# Patient Record
Sex: Male | Born: 1955 | Race: White | Hispanic: No | Marital: Married | Smoking: Current every day smoker
Health system: Southern US, Community
[De-identification: ages and names within clinical notes are randomized; demographics above are authoritative.]

## PROBLEM LIST (undated history)

## (undated) DIAGNOSIS — R0602 Shortness of breath: Secondary | ICD-10-CM

## (undated) DIAGNOSIS — J449 Chronic obstructive pulmonary disease, unspecified: Secondary | ICD-10-CM

## (undated) DIAGNOSIS — N401 Enlarged prostate with lower urinary tract symptoms: Secondary | ICD-10-CM

## (undated) DIAGNOSIS — Z79891 Long term (current) use of opiate analgesic: Secondary | ICD-10-CM

## (undated) DIAGNOSIS — M1990 Unspecified osteoarthritis, unspecified site: Secondary | ICD-10-CM

## (undated) DIAGNOSIS — S22000A Wedge compression fracture of unspecified thoracic vertebra, initial encounter for closed fracture: Secondary | ICD-10-CM

## (undated) DIAGNOSIS — K5903 Drug induced constipation: Secondary | ICD-10-CM

## (undated) DIAGNOSIS — S2020XA Contusion of thorax, unspecified, initial encounter: Secondary | ICD-10-CM

## (undated) DIAGNOSIS — IMO0001 Reserved for inherently not codable concepts without codable children: Secondary | ICD-10-CM

## (undated) DIAGNOSIS — J312 Chronic pharyngitis: Secondary | ICD-10-CM

## (undated) DIAGNOSIS — Z9981 Dependence on supplemental oxygen: Secondary | ICD-10-CM

## (undated) DIAGNOSIS — IMO0002 Reserved for concepts with insufficient information to code with codable children: Secondary | ICD-10-CM

## (undated) DIAGNOSIS — R51 Headache: Secondary | ICD-10-CM

## (undated) DIAGNOSIS — G4730 Sleep apnea, unspecified: Secondary | ICD-10-CM

## (undated) DIAGNOSIS — G894 Chronic pain syndrome: Secondary | ICD-10-CM

## (undated) DIAGNOSIS — Z860100 Personal history of colon polyps, unspecified: Secondary | ICD-10-CM

## (undated) DIAGNOSIS — J324 Chronic pansinusitis: Secondary | ICD-10-CM

## (undated) DIAGNOSIS — I219 Acute myocardial infarction, unspecified: Secondary | ICD-10-CM

## (undated) DIAGNOSIS — L089 Local infection of the skin and subcutaneous tissue, unspecified: Secondary | ICD-10-CM

## (undated) DIAGNOSIS — F329 Major depressive disorder, single episode, unspecified: Secondary | ICD-10-CM

## (undated) DIAGNOSIS — H65493 Other chronic nonsuppurative otitis media, bilateral: Secondary | ICD-10-CM

## (undated) DIAGNOSIS — G709 Myoneural disorder, unspecified: Secondary | ICD-10-CM

## (undated) DIAGNOSIS — Z973 Presence of spectacles and contact lenses: Secondary | ICD-10-CM

## (undated) DIAGNOSIS — R1310 Dysphagia, unspecified: Secondary | ICD-10-CM

## (undated) DIAGNOSIS — F32A Depression, unspecified: Secondary | ICD-10-CM

## (undated) DIAGNOSIS — K219 Gastro-esophageal reflux disease without esophagitis: Secondary | ICD-10-CM

## (undated) DIAGNOSIS — E119 Type 2 diabetes mellitus without complications: Secondary | ICD-10-CM

## (undated) DIAGNOSIS — F419 Anxiety disorder, unspecified: Secondary | ICD-10-CM

## (undated) DIAGNOSIS — I10 Essential (primary) hypertension: Secondary | ICD-10-CM

## (undated) HISTORY — DX: Benign prostatic hyperplasia with lower urinary tract symptoms: N40.1

## (undated) HISTORY — PX: APPENDECTOMY: SHX54

## (undated) HISTORY — DX: Acute myocardial infarction, unspecified: I21.9

## (undated) HISTORY — DX: Chronic pain syndrome: G89.4

## (undated) HISTORY — DX: Chronic pharyngitis: J31.2

## (undated) HISTORY — DX: Type 2 diabetes mellitus without complications: E11.9

## (undated) HISTORY — DX: Other chronic nonsuppurative otitis media, bilateral: H65.493

## (undated) HISTORY — DX: Contusion of thorax, unspecified, initial encounter: S20.20XA

## (undated) HISTORY — PX: HEMORROIDECTOMY: SUR656

## (undated) HISTORY — DX: Personal history of colonic polyps: Z86.010

## (undated) HISTORY — DX: Chronic pansinusitis: J32.4

## (undated) HISTORY — PX: BACK SURGERY: SHX140

## (undated) HISTORY — DX: Dependence on supplemental oxygen: Z99.81

## (undated) HISTORY — DX: Long term (current) use of opiate analgesic: Z79.891

## (undated) HISTORY — DX: Dysphagia, unspecified: R13.10

## (undated) HISTORY — DX: Essential (primary) hypertension: I10

## (undated) HISTORY — DX: Drug induced constipation: K59.03

## (undated) HISTORY — PX: NECK SURGERY: SHX720

## (undated) HISTORY — DX: Personal history of colon polyps, unspecified: Z86.0100

---
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
## 1998-06-26 ENCOUNTER — Ambulatory Visit (HOSPITAL_COMMUNITY): Admission: RE | Admit: 1998-06-26 | Discharge: 1998-06-26 | Payer: Self-pay | Admitting: Neurological Surgery

## 1998-06-26 ENCOUNTER — Encounter: Payer: Self-pay | Admitting: Neurological Surgery

## 2000-11-09 ENCOUNTER — Encounter: Payer: Self-pay | Admitting: Neurological Surgery

## 2000-11-09 ENCOUNTER — Encounter: Admission: RE | Admit: 2000-11-09 | Discharge: 2000-11-09 | Payer: Self-pay | Admitting: Neurological Surgery

## 2001-11-14 ENCOUNTER — Ambulatory Visit (HOSPITAL_COMMUNITY): Admission: RE | Admit: 2001-11-14 | Discharge: 2001-11-14 | Payer: Self-pay | Admitting: Neurological Surgery

## 2001-11-14 ENCOUNTER — Encounter: Payer: Self-pay | Admitting: Neurological Surgery

## 2001-12-13 ENCOUNTER — Encounter: Payer: Self-pay | Admitting: Neurological Surgery

## 2001-12-13 ENCOUNTER — Observation Stay (HOSPITAL_COMMUNITY): Admission: RE | Admit: 2001-12-13 | Discharge: 2001-12-14 | Payer: Self-pay | Admitting: Neurological Surgery

## 2002-07-28 ENCOUNTER — Ambulatory Visit (HOSPITAL_COMMUNITY): Admission: RE | Admit: 2002-07-28 | Discharge: 2002-07-28 | Payer: Self-pay | Admitting: Hematology and Oncology

## 2002-07-28 ENCOUNTER — Encounter: Payer: Self-pay | Admitting: Neurological Surgery

## 2003-09-09 ENCOUNTER — Emergency Department (HOSPITAL_COMMUNITY): Admission: EM | Admit: 2003-09-09 | Discharge: 2003-09-09 | Payer: Self-pay | Admitting: *Deleted

## 2003-09-28 ENCOUNTER — Ambulatory Visit (HOSPITAL_COMMUNITY): Admission: RE | Admit: 2003-09-28 | Discharge: 2003-09-28 | Payer: Self-pay | Admitting: Neurological Surgery

## 2003-11-08 ENCOUNTER — Inpatient Hospital Stay (HOSPITAL_COMMUNITY): Admission: RE | Admit: 2003-11-08 | Discharge: 2003-11-09 | Payer: Self-pay | Admitting: Neurological Surgery

## 2004-06-24 ENCOUNTER — Encounter (INDEPENDENT_AMBULATORY_CARE_PROVIDER_SITE_OTHER): Payer: Self-pay | Admitting: *Deleted

## 2004-06-24 ENCOUNTER — Inpatient Hospital Stay (HOSPITAL_COMMUNITY): Admission: RE | Admit: 2004-06-24 | Discharge: 2004-06-26 | Payer: Self-pay | Admitting: Neurological Surgery

## 2007-12-14 ENCOUNTER — Ambulatory Visit (HOSPITAL_COMMUNITY): Admission: RE | Admit: 2007-12-14 | Discharge: 2007-12-14 | Payer: Self-pay | Admitting: Neurological Surgery

## 2008-02-06 ENCOUNTER — Ambulatory Visit: Payer: Self-pay | Admitting: *Deleted

## 2008-02-06 ENCOUNTER — Inpatient Hospital Stay (HOSPITAL_COMMUNITY): Admission: RE | Admit: 2008-02-06 | Discharge: 2008-02-08 | Payer: Self-pay | Admitting: Neurological Surgery

## 2008-03-23 ENCOUNTER — Ambulatory Visit (HOSPITAL_COMMUNITY): Admission: RE | Admit: 2008-03-23 | Discharge: 2008-03-23 | Payer: Self-pay | Admitting: Neurological Surgery

## 2008-06-14 HISTORY — PX: ESOPHAGOGASTRODUODENOSCOPY: SHX1529

## 2010-06-06 HISTORY — PX: COLONOSCOPY: SHX174

## 2010-09-09 NOTE — Op Note (Signed)
NAMEAVRAHAM, [name] NO.:  [id number]   MEDICAL RECORD NO.:  [id number]          PATIENT TYPE:  INP   LOCATION:  3113                         FACILITY:  [facility]   PHYSICIAN:  Stefani Dama, M.D.  DATE OF BIRTH:  26-Sep-1955   DATE OF PROCEDURE:  02/06/2008  DATE OF DISCHARGE:                               OPERATIVE REPORT   PREOPERATIVE DIAGNOSES:  Spondylosis and stenosis, L3-L4; status post  arthrodesis, L4 to sacrum.   POSTOPERATIVE DIAGNOSES:  Spondylosis and stenosis, L3-L4; status post  arthrodesis, L4 to sacrum.   PROCEDURE:  Anterior lumbar decompression, L3-L4 arthrodesis with PEEK  spacer, local autograft, allograft, and anterior titanium plate  fixation.   SURGEON:  Stefani Dama, MD   FIRST ASSISTANT:  None.   CO-SURGEON:  Balinda Quails, MD for approach and closure.   INDICATIONS:  [name] is a 55 year old individual who has had  previous spondylosis and stenosis at L4-5 and L5-S1.  He has had chronic  back pain, severe cervical spondylosis including morbid obesity, smoking  history, and is now developed adjacent level of disease at L3-L4 with  stenosis at that level.  An anterior lumbar interbody arthrodesis is  being planned for that level.   PROCEDURE:  The patient was brought to the operating room and placed on  table in the supine position.  After smooth induction of general  endotracheal anesthesia, his abdomen was prepped with alcohol and  DuraPrep and then draped in sterile fashion.  A transverse incision was  made at the L3-L4 level after localizing this radiographically and then  Dr. Madilyn Fireman performed the anterior exposure to the retroperitoneal space.  When the disk space was exposed, I incised the disk space with #15  blade.  A combination of Kerrison rongeurs was used to evacuate a  significant quantity of severely desiccated and degenerated disk  material at the L3-L4 space.  The endplates were cleared of any  attachments  to the disk and the most dorsal aspect of the disk space.  The disk was removed and the ligament was undercut with a 2-mm and 3-mm  Kerrison punch.  Dissection was carried out to either side of lateral  midline to maintain good decompression exposure.  Then, after  decorticating the endplates with high-speed drill, the interspace was  sized with fluoroscopic guidance, and it was felt that a 16-mm tall, 30  x 33 mm footprint, 6-degree lordosis spacer would fit best.  This was  then filled with a combination of autograft and allograft.  Autograft  was in the form of bone marrow aspirate and the allograft was Vitoss  bone sponge mixed with an extra small mixture of Infuse.  This was  placed into the spacer and the spacer was countersunk appropriately  using fluoroscopic guidance.  Two small titanium plates were then placed  into the spacer to the vertebral body above and below to secure the  spacer and  position.  Final localizing radiograph was obtained in the AP and  lateral projection, and then the procedure was turned over to Dr. Liliane Bade  for final closure.  Blood loss for this portion of the procedure  was estimated 150 mL.  The patient tolerated the procedure well.      Stefani Dama, M.D.  Electronically Signed     HJE/MEDQ  D:  02/06/2008  T:  02/07/2008  Job:  [id number]

## 2010-09-09 NOTE — Op Note (Signed)
NAME:  [name], [name] NO.:  [id number]   MEDICAL RECORD NO.:  [id number]          PATIENT TYPE:  INP   LOCATION:  3113                         FACILITY:  [facility]   PHYSICIAN:  Balinda Quails, M.D.    DATE OF BIRTH:  [birth year]   DATE OF PROCEDURE:  02/06/2008  DATE OF DISCHARGE:                               OPERATIVE REPORT   SURGEON:  P Bud Face, MD   CO SURGEON:  Stefani Dama, MD   ANESTHETIC:  General endotracheal.   PREOPERATIVE DIAGNOSIS:  L3-4 degenerative disk disease.   POSTOPERATIVE DIAGNOSIS:  L3-4 degenerative disk disease.   PROCEDURE:  L3-4 anterior lumbar interbody fusion (ALIF).   CLINICAL NOTE:  Mr. [name] is a 55 year old gentleman with history  of chronic back problems.  Previous fusion at L4-5 and L5-S1.  These  were carried out posteriorly.  Brought to the operating room at this  time for planned L3-4 ALIF.  The patient was seen preoperatively,  details of the operative procedure reviewed including potential risks.  Major complication rate 2-3% to include but not limited to MI, DVT,  pulmonary embolus, bleeding, transfusion, infection, sexual dysfunction,  ureter injury or other major complication.   OPERATIVE PROCEDURE:  The patient was brought to the operating room in  stable condition.  Placed under general endotracheal anesthesia.  Foley  catheter, arterial line, and central venous catheter in place.  In the  supine position, the abdomen prepped and draped in a sterile fashion.   A transverse skin incision made at projection of L3-4 in the left lower  quadrant.  The incision extended deeply through the subcutaneous tissue.  Left anterior rectus sheath incised from midline to lateral margin of  the rectus muscle.  The incision was extended laterally due to the  patient's large body habitus and the oblique and transversus muscles  were also incised laterally beyond the rectus sheath.  The posterior  rectus sheath then  incised transversely and the peritoneum pushed off  the posterior rectus sheath creating the retroperitoneal space.  The  peritoneal contents rotated anteriorly.  The left psoas muscle  identified.  Left genitofemoral nerve was preserved.  The L3-4 disk was  palpated and identified with fluoroscopy and a spinal needle.  The L4  and L3 segmental vessels were ligated with clips and divided.  This  allowed full mobilization of the left common iliac vein which was  retracted to the right.  The peritoneal contents, ureter and the left  iliac vein were all retracted to the right.  The presacral fascial layer  was incised and the disk was exposed from left-to-right.  Full exposure  of the disk completed.   A Thompson-Brau Retractor System used.  Reverse lip blades were placed  on lateral margins of L3-4 bilaterally and malleable retractors placed  superiorly and inferiorly.  This allowed full exposure of the disk.  The  ALIF then completed by Dr. Danielle Dess.   After completion of the ALIF, all instruments were removed.  No  significant bleeding.  Pulse oximetry remained normal in left lower  extremity.  The posterior rectus sheath was closed with running 0 Vicryl suture.  The lateral muscle areas were closed with a single layer of interrupted  #1 Vicryl suture.  The anterior rectus sheath closed with running 0  Vicryl suture.  Subcutaneous layer deeply closed with running 2-0 Vicryl  suture, superficial layer with running 3-0 Vicryl suture and skin closed  with 4-0 Monocryl.  Dermabond applied.  Sterile dressings applied.   The patient tolerated the procedure well.  Transferred to recovery room  in stable condition.      Balinda Quails, M.D.  Electronically Signed     PGH/MEDQ  D:  02/06/2008  T:  02/07/2008  Job:  [id number]

## 2010-09-12 NOTE — Op Note (Signed)
NAMEHAYATO, [name] NO.:  [id number]   MEDICAL RECORD NO.:  [id number]          PATIENT TYPE:  INP   LOCATION:  2859                         FACILITY:  [facility]   PHYSICIAN:  Stefani Dama, M.D.  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   PROCEDURE:  Posterior supplemental arthrodesis with allograft and segmental  fixation from C4-C7 with Alphatek instrumentation.   SURGEON:  Stefani Dama, M.D.   FIRST ASSISTANT:  Dr. Lovell Sheehan.   ANESTHESIA:  General endotracheal.   INDICATIONS:  Mr. [name] is a 55 year old individual who has had  problems with severe spondylitic disease in the cervical spine in the past.  He underwent initially an anterior cervical arthrodesis at C4-5 and C5-6.  He was then noted to have developed spondylitic disease at C6-C7.  About six  months ago he underwent arthrodesis anteriorly with decompression of C6-C7  level. During subsequent postoperative period, it was noted that he had  continued to complain of substantial pain and further evaluation  demonstrated that he had developed the pseudoarthrosis at C5-C6 level.  Review of his previous film suggested that there was a solid arthrodesis at  that level with no motion on flexion/extension films, however, subsequent  workup since as recuperation has demonstrated that there indeed is evidence  of motion across the C5-C6 level and osteophytic processes appear to be  evolving both anteriorly and posteriorly.  After careful consideration of  the options, the patient was advised regarding supplemental posterior fusion  with fixation being placed from all the fused levels from C4 down to C7. He  is now taken to the operating room for this procedure.   PROCEDURE:  The patient was brought to the  operating room supine on the  stretcher.  After smooth induction of general endotracheal anesthesia, he  was placed in the three-pin head rest and then carefully turned to the prone  position. Bony prominences were appropriately padded and protected and care  was taken to position his neck carefully in a slightly flexed position so as  to unfold the posterior skin folds.  The back of the neck was then prepped  with DuraPrep, draped in sterile fashion. A vertical incision was created in  the midline of the back and small skin tag right of midline in the midst of  the incision was excised and sent for separate pathologic examination.  Dissection was taken down to the cervical dorsal fascia which was opened on  either side of the midline and the first palpable spinous process was felt  to be the spinous process of C2 and then by further palpation, the spinous  processes of C3 and C4 were marked and a localizing radiograph identified  these positively. Further visualization below the level of C4 was not  possible because of the patient's rather large size across the shoulders and  his neck.  Therefore, once the C4 was identified  positively, subperiosteal  dissection was carried out along the laminar arches and facet joints from C4  down to C7.  The areas were packed off laterally and then a McCullough  retractor was placed deep in the wound and exposed the spinous processes,  laminar arches and the facette joints overlying these areas.  Care was taken  to maintain the dissection in subperiosteal plane and then fascia overlying  the facette joint was stripped out so that the facet joints could be drilled  out with a 2.3 mm dissecting tool. Once the cartilaginous fascia was  removed, the interspinous ligament was opened from between the spinous  processes of C4 to C7. A large round bur was then used to decorticate all  the bony margins along the facet and the laminar and spinous processes so as   to allow packing of bone graft in this region. Bleeding from the underlying  cancellus bone was harvested and this was mixed with an aliquot of bone  graft substitute which was a silicated substance, for a total volume of 10  mL of bone allograft that was mixed with the patient's own blood obtained  from the bleeding bony surfaces.  Facette screw entry sites were then chosen  along each of the facette laminar complexes using standard Roy-Camille  technique using a trajectory slightly medial 30 degrees cephalad, 30 degrees  lateral projection.  The screws were then placed.  Screw holes were drilled  down to 14 mm size.  There were 3 mm diameter holes.  They were tapped with  3.5 mm tap and then screws were placed in the C4, C5, C6 and C7 lateral  masses on each side.  The alignment of the screws was then obtained perfunctorily and the 6 cm rod  was sized between these and curved into a very modest curve to fit the screw  heads nicely.  The screw heads were then applied and they were sequentially  tightened down adding small amounts of torque so as to maintain the  alignment of the cervical spine in its anatomic position. Once the system  was torqued down totally, bone graft was then packed into the facette joints  and all along the interlaminar spaces from C4-C7 with a bone graft thus  being packed.  The wound was inspected for hemostasis and then the retractor  was removed and the paraspinal muscles were replaced into their original  position. The cervical dorsal fascia was closed with #1 Vicryl over a small  Jackson-Pratt drain which was brought out through a separate stab incision.  The cervical dorsal fascia was then further closed with 2-0 Vicryl, 3-0  Vicryl and surgical staples were placed in the cervical skin. The 3-0 nylon  suture was placed in the drain site to secure it. The wound was then cleansed with bacitracin irrigating solution. A dry sterile dressing was  applied to the  skin. The patient was then turned back to the supine position  with the head frame and pin sites being removed.  They were inspected and no  bleeding was noted immediately. The patient was then extubated and returned  to recovery room in stable condition.      HJE/MEDQ  D:  06/24/2004  T:  06/24/2004  Job:  [id number]

## 2010-09-12 NOTE — Op Note (Signed)
NAME:  [name], [name] NO.:  [id number]   MEDICAL RECORD NO.:  [id number]                   PATIENT TYPE:  INP   LOCATION:  3004                                 FACILITY:  [facility]   PHYSICIAN:  Stefani Dama, M.D.               DATE OF BIRTH:  05-May-1955   DATE OF PROCEDURE:  12/13/2001  DATE OF DISCHARGE:  12/14/2001                                 OPERATIVE REPORT   PREOPERATIVE DIAGNOSIS:  Cervical spondylosis with left cervical  radiculopathy, C4-5, C5-6, and C6-7.   POSTOPERATIVE DIAGNOSIS:  Cervical spondylosis with left cervical  radiculopathy, C4-5, C5-6, and C6-7.   PROCEDURE:  Anterior cervical diskectomy and arthrodesis with structural  allograft, Synthes fixation, C4-5, C5-6, and C6-7.   SURGEON:  Stefani Dama, M.D.   ASSISTANT:  Hewitt Shorts, M.D.   ANESTHESIA:  General endotracheal.   INDICATIONS:  The patient is a 55 year old individual who has had  significant spondylitic changes of his lumbar spine and now has developed  problems in his cervical spine with cervical radiculopathy primarily  involving the C5 and C6 distributions on the left side.  MRI demonstrates  that he has significant spondylitic disease, and a myelogram confirmed this  process about a month ago.  He was advised, having failed extensive efforts  at conservative management of this process, regarding anterior diskectomy  and arthrodesis.   DESCRIPTION OF PROCEDURE:  The patient was brought to the operating room and  placed on the table in the supine position.  After the smooth induction of  general endotracheal anesthesia, he was placed in five pounds of Holter  traction.  The neck was prepped with Duraprep and draped in a sterile  fashion.  A transverse incision was made on the left side of the neck and  carried down to the platysma.  The plane between the sternocleidomastoid and  the strap muscles was dissected bluntly until the prevertebral space  was  reached.  The first identifiable disk space was noted to be that of C5-6 on  the radiograph.  Dissection of the prevertebral tissues was then undertaken  cephalad and caudad to expose C4-5, C5-6, and C6-7.  A self-retaining Caspar  retractor was placed under the longus colli muscle, and then a diskectomy at  C5-6 was undertaken, opening the anterior portion of the longitudinal  ligament with a 15 blade.  A combination of curettes and rongeurs was then  used to evacuate the disk of significant degenerated disk material.  As the  posterior longitudinal ligament was reached, there was noted to be evidence  of disk material under the ligament in the subligamentous space behind the  vertebral body of C5.  This was retrieved and then the ligament was opened  fully.  The nerve root was decompressed.  The uncinate process off of the  body of C6 was removed from the left side.  The  right side was similarly  decompressed, and no significant spurring was identified on this side.  Once  the dissection was completed, hemostasis from the epidural veins was  obtained with some Gelfoam pledgets soaked in thrombin, which were later  removed and irrigated away.  A 7 mm tricortical graft was placed with the  cortical surface facing dorsally.  The ventral aspect was trimmed.  Attention was then turned to C6-7, where a similar procedure was carried  out.  Here again a large osteophytic overgrowth and some subligamentous disk  herniation was encountered on the left side at C6-7.  Once this was  decompressed, a 7 mm tricortical graft was again fashioned into the  interspace.  Then C4-5 was then attended to in a similar fashion.  Once the  diskectomies were completed, hemostasis was checked in all of the soft  tissues.  A 57 mm standard-size Synthes plate was affixed to the ventral  aspects of the vertebral bodies with eight locking 4 x 14 mm screws.  Placement of the construct was obtained radiographically,  and it was felt to  be adequate.  The area was then checked for hemostasis, and the platysma was  closed with 3-0 Vicryl in interrupted fashion, and 3-0 Vicryl was used in  the subcuticular tissues.  Dermabond was placed on the skin.  The patient  tolerated the procedure well and was returned to the recovery room in stable  condition.                                               Stefani Dama, M.D.    Merla Riches  D:  12/13/2001  T:  12/14/2001  Job:  [id number]
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
## 2010-09-12 NOTE — Discharge Summary (Signed)
[name], KADRMAS NO.:  [id number]   MEDICAL RECORD NO.:  [id number]          PATIENT TYPE:  INP   LOCATION:  3012                         FACILITY:  [facility]   PHYSICIAN:  Stefani Dama, M.D.  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   MAJOR OPERATION:  Posterior supplemental arthrodesis with fixation C4-C7.   INDICATIONS:  [name] is a 55 year old individual who has had  previous anterior arthrodesis at C5-6 and at C4-5.  The patient had  complaints of significant neck pain, shoulder pain with radiation to the  proximal aspect of the arms.  After careful evaluation he was found to have  pseudoarthrosis at the level C5-6 and at C6-7.  He was advised regarding  supplemental arthrodesis which will be done posterior approach and this was  undertaken on June 24, 2004 via a posterior stabilization using lateral  mass facet screws from C4-C7 and placing bone graft in the intervening space  with Allograft.  The patient underwent the above-named procedure and  tolerated it fairly well.  Complained of significant neck pain and shoulder  pain on the first and on the second it seemed that the pain was subsiding.  His incision remained clean and dry.  A surgical drain was placed  immediately postoperatively and this was removed on the first postoperative  day as the patient was able to tolerate oral pain medications.  He was  ambulatory in a hard cervical collar.  He was discharged home with  postoperative instructions.  He was given a prescription for Percocet as  needed for pain in addition for Valium as muscle spasm.   CONDITION ON DISCHARGE:  Improved.   FINAL DIAGNOSES:  1.  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  2.  Chronic depression with anxiety  disorder.       HJE/MEDQ  D:  09/04/2004  T:  09/04/2004  Job:  [id number]

## 2011-01-26 LAB — TYPE AND SCREEN
ABO/RH(D): O POS
Antibody Screen: NEGATIVE

## 2011-01-26 LAB — GLUCOSE, CAPILLARY: Glucose-Capillary: 127 — ABNORMAL HIGH

## 2011-01-26 LAB — CBC
HCT: 46.1
Platelets: 220
RBC: 4.79
WBC: 8.8

## 2011-01-26 LAB — BASIC METABOLIC PANEL
Chloride: 105
Creatinine, Ser: 0.85
GFR calc Af Amer: >60
GFR calc non Af Amer: >60
Sodium: 140

## 2011-01-26 LAB — ABO/RH: ABO/RH(D): O POS

## 2011-07-01 ENCOUNTER — Other Ambulatory Visit: Payer: Self-pay | Admitting: Neurological Surgery

## 2011-07-01 DIAGNOSIS — M5416 Radiculopathy, lumbar region: Secondary | ICD-10-CM

## 2011-07-04 ENCOUNTER — Ambulatory Visit
Admission: RE | Admit: 2011-07-04 | Discharge: 2011-07-04 | Disposition: A | Discharge status: Home / Self Care | Payer: Medicare Other | Source: Ambulatory Visit | Attending: Neurological Surgery | Admitting: Neurological Surgery

## 2011-07-04 DIAGNOSIS — M5416 Radiculopathy, lumbar region: Secondary | ICD-10-CM

## 2011-07-04 MED ORDER — GADOBENATE DIMEGLUMINE 529 MG/ML IV SOLN
20.0000 mL | Freq: Once | INTRAVENOUS | Status: AC | PRN
  Administered 2011-07-04: 20 mL via INTRAVENOUS

## 2011-07-10 ENCOUNTER — Other Ambulatory Visit: Payer: Self-pay | Admitting: Neurological Surgery

## 2011-07-16 ENCOUNTER — Encounter (HOSPITAL_COMMUNITY): Payer: Self-pay | Admitting: Pharmacy Technician

## 2011-07-21 ENCOUNTER — Other Ambulatory Visit (HOSPITAL_COMMUNITY): Payer: No Typology Code available for payment source

## 2011-07-21 ENCOUNTER — Encounter (HOSPITAL_COMMUNITY): Payer: Self-pay

## 2011-07-21 ENCOUNTER — Encounter (HOSPITAL_COMMUNITY)
Admission: RE | Admit: 2011-07-21 | Discharge: 2011-07-21 | Disposition: A | Discharge status: Home / Self Care | Payer: No Typology Code available for payment source | Source: Ambulatory Visit | Attending: Neurological Surgery | Admitting: Neurological Surgery

## 2011-07-21 HISTORY — DX: Unspecified osteoarthritis, unspecified site: M19.90

## 2011-07-21 HISTORY — DX: Headache: R51

## 2011-07-21 HISTORY — DX: Shortness of breath: R06.02

## 2011-07-21 HISTORY — DX: Sleep apnea, unspecified: G47.30

## 2011-07-21 HISTORY — DX: Myoneural disorder, unspecified: G70.9

## 2011-07-21 HISTORY — DX: Reserved for inherently not codable concepts without codable children: IMO0001

## 2011-07-21 HISTORY — DX: Local infection of the skin and subcutaneous tissue, unspecified: L08.9

## 2011-07-21 HISTORY — DX: Chronic obstructive pulmonary disease, unspecified: J44.9

## 2011-07-21 HISTORY — DX: Reserved for concepts with insufficient information to code with codable children: IMO0002

## 2011-07-21 HISTORY — DX: Anxiety disorder, unspecified: F41.9

## 2011-07-21 LAB — BASIC METABOLIC PANEL
BUN: 8 mg/dL (ref 6–23)
Chloride: 103 mEq/L (ref 96–112)
GFR calc Af Amer: >90 mL/min (ref >90)
GFR calc non Af Amer: >90 mL/min (ref >90)
Glucose, Bld: 101 mg/dL — ABNORMAL HIGH (ref 70–99)
Potassium: 5.2 mEq/L — ABNORMAL HIGH (ref 3.5–5.1)
Sodium: 137 mEq/L (ref 135–145)

## 2011-07-21 LAB — CBC
HCT: 48.9 % (ref 39.0–52.0)
Hemoglobin: 17.3 g/dL — ABNORMAL HIGH (ref 13.0–17.0)
MCH: 33.1 pg (ref 26.0–34.0)
MCHC: 35.4 g/dL (ref 30.0–36.0)
RBC: 5.22 MIL/uL (ref 4.22–5.81)

## 2011-07-21 LAB — TYPE AND SCREEN
ABO/RH(D): O POS
Antibody Screen: NEGATIVE

## 2011-07-21 NOTE — Progress Notes (Signed)
Will request sleep study fr. [facility].

## 2011-07-21 NOTE — Pre-Procedure Instructions (Signed)
20 [name]  07/21/2011   Your procedure is scheduled on:  07/27/2011  Report to [facility] at 5:30 AM.  Call this number if you have problems the morning of surgery: [phone]   Remember:   Do not eat food:After Midnight.  May have clear liquids: up to 4 Hours before arrival.  NOTHING AFTER 1:30 A.M.  Clear liquids include soda, tea, black coffee, apple or grape juice, broth.  Take these medicines the morning of surgery with A SIP OF WATER:  VALIUM, INHALERS, FLOMAX, LYRICA, PAIN MEDICINE AS NEEDED   Do not wear jewelry, make-up or nail polish.  Do not wear lotions, powders, or perfumes. You may wear deodorant.   Do not bring valuables to the hospital.  Contacts, dentures or bridgework may not be worn into surgery.  Leave suitcase in the car. After surgery it may be brought to your room.  For patients admitted to the hospital, checkout time is 11:00 AM the day of discharge.   Patients discharged the day of surgery will not be allowed to drive home.  Name and phone number of your driver:   WITH FAMILY  Special Instructions: CHG Shower Use Special Wash: 1/2 bottle night before surgery and 1/2 bottle morning of surgery.   Please read over the following fact sheets that you were given: Pain Booklet, Coughing and Deep Breathing, Blood Transfusion Information, MRSA Information and Surgical Site Infection Prevention

## 2011-07-21 NOTE — Progress Notes (Signed)
Pt. Denies CP, difficulty breathing.  Reports SOB fr. Pain. No need for CXR or EKG, per anesth. Orders . Last EKG- 2009, preop.

## 2011-07-26 MED ORDER — CEFAZOLIN SODIUM-DEXTROSE 2-3 GM-% IV SOLR
2.0000 g | Freq: Once | INTRAVENOUS | Status: AC
  Administered 2011-07-27: 2 g via INTRAVENOUS
  Administered 2011-07-27: 1 g via INTRAVENOUS
  Filled 2011-07-26: qty 50

## 2011-07-26 NOTE — H&P (Signed)
[name] is an 56 y.o. male.   Chief Complaint: Back pain , leg weakness HPI:  [name]   #[id number] DOB:  02-Dec-1955 07/08/2011:  [name] returns to the office today, his birthday.  On the 9th he had an MRI of his lumbar spine. The study demonstrates that at L3-4 where he has an anterior interbody fusion, he has a significant posterolateral spur causing severe stenosis and left-sided nerve root compromise.    My concern is that he may have a pseudoarthrosis at the level of L3-4.  If this is the case, then he needs revision of his fusion. This would be done through a posterior operation. I demonstrated the findings to [name] and his wife. I noted that he has been having mostly left lower extremity symptoms and given this image, I believe that ultimately he needs to have this area decompressed and re-stabilized if necessary.  This would be done through a posterior operation.  This may involve placement of hardware across L3-4 and revision of the lower hardware in order to accommodate this.  Though this is a substantial operation, I believe that in [name]'s case he should feel considerably better once he has this area decompressed and stabilized.  We will plan on scheduling the surgery at the earliest convenience.          Stefani Dama, M.D./aft  [facility] [address] [place], [place] [address] [phone] Fax: [phone] [name]. #[id number] DOB: [birth year] 05/09/18 10 HPI: [name] returns to the office today for further followup regarding his lumbar spine having had an anterior lumbar decompression arthrodesis at the L3-4 level above a previously posteriorly stabilized L4 to sacrum. Clinically, [name] is having still persistence of left-sided testicular pain. He notes that the vise may have been eased just a little bit but the pain is quite severe still. He had been seen by an urologist and was told that he had prostatitis. He has been on  four weeks of doxycycline and feels that this may be easing slightly. Nonetheless, he is still requiring considerable pain medication and he notes that his pain management has been very poor. He is using between four and six of the oxycodone 10/325 mg a day in addition to OxyContin 20 thg b.i.d. He does not feel that the oxycodone does take care of his pain quite as well as the hydrocodone did. RECOMMENDATIONS: We discussed switching him back to hydrocodone today. I indicated that he would likely require at least 8 of the hydrocodone 10/325 mg a day to get some equivalency to his current pain regimen. I will do this for him today, writing him a prescription for the hydrocodone 10/ mg. He is taking Lyrica 150mg  a day and I also advised that we can double this medication to 150mg  b.i.d. and I will write him a new prescription for Lyrica. Additionally, [name] tells me that he has been concerned about the enlarged gallbladder that we saw on the scan. He does have a gastroenterologist in the [place] area that he has trusted in the past with other family members, and he would like to be [name] and evaluated by him for his enlarged gallbladder. I will make an arrangement for him to see Dr. Chales Abrahams at [facility]. In the meantime, his radiographs do show that his interbody graft is nicely placed. It is healing well. The alignment is well maintained. His back, otherwise, looks quite stable. I am hopeful  that his pain will come under better control with the passage of time. [name] is also noting to me that he has been having worsening neck pain and neck symptoms and his headaches were coming back.  Past Medical History  Diagnosis Date  . Normal cardiac stress test     2005, done prior to back surgery  . COPD (chronic obstructive pulmonary disease)   . Sleep apnea     study- 2012[facility] , Dr. Blenda Nicely, [address]  , uses CPAP  qo night   . Headache     uses excedrin prn  . Neuromuscular  disorder     pseudoarthrosis   . Arthritis     lumbar stenosis   . Anxiety     went off medicines for anxiety 6 months ago ( 12/2010), pt. "stays nervous", per pt.   . Cysts     groin area (on & off- for 4 +yrs.) for which he uses Doxycyline on going, BID, Rx fr. Dr. Fatima Blank in [place], [place].,    . History of normal resting EKG     2009, preop  . Finger infection     05/2011, L hand, index finger  - I&D in MD office, treated /w antibiotic.Marland KitchenMarland KitchenMarland Kitchen?name  . Shortness of breath     pt. reports related to pain     Past Surgical History  Procedure Date  . Back surgery     multiple, lumbar & cerv. fusions, last low back- 2009  . Hemorroidectomy     [facility]  . Appendectomy     as a teen     Family History  Problem Relation Age of Onset  . Anesthesia problems Neg Hx    Social History:  reports that he has been smoking.  He does not have any smokeless tobacco history on file. He reports that he does not drink alcohol or use illicit drugs.  Allergies: No Known Allergies  Medications Prior to Admission  Medication Dose Route Frequency Provider Last Rate Last Dose  . ceFAZolin (ANCEF) IVPB 2 g/50 mL premix  2 g Intravenous Once Barnett Abu, MD       Medications Prior to Admission  Medication Sig Dispense Refill  . ACETAMINOPHEN-CAFF-BUTALBITAL 50-500-40 MG CAPS Take 1 tablet by mouth every 4 (four) hours as needed. For migraine      . diazepam (VALIUM) 5 MG tablet Take 5 mg by mouth 4 (four) times daily.      Marland Kitchen doxycycline (VIBRA-TABS) 100 MG tablet Take 100 mg by mouth 2 (two) times daily.      . mometasone-formoterol (DULERA) 100-5 MCG/ACT AERO Inhale 2 puffs into the lungs 2 (two) times daily.      Marland Kitchen oxycodone (OXYCONTIN) 30 MG TB12 Take 30 mg by mouth 3 (three) times daily.      . pregabalin (LYRICA) 150 MG capsule Take 150 mg by mouth 2 (two) times daily.      . Tamsulosin HCl (FLOMAX) 0.4 MG CAPS Take 0.4 mg by mouth every morning.       . tiotropium (SPIRIVA) 18 MCG  inhalation capsule Place 18 mcg into inhaler and inhale daily.      . traZODone (DESYREL) 50 MG tablet Take 50 mg by mouth at bedtime as needed. For sleep        No results found for this or any previous visit (from the past 48 hour(s)). No results found.  Review of Systems  HENT: Negative.   Eyes: Negative.   Respiratory:  Negative.   Cardiovascular: Negative.   Gastrointestinal: Negative.   Genitourinary: Negative.   Musculoskeletal: Positive for back pain.  Skin: Negative.   Neurological: Positive for sensory change, focal weakness and weakness.  Endo/Heme/Allergies: Negative.   Psychiatric/Behavioral: Positive for depression.    There were no vitals taken for this visit. Physical Exam  Constitutional: He is oriented to person, place, and time. He appears well-developed and well-nourished.  HENT:  Head: Normocephalic and atraumatic.  Eyes: Conjunctivae and EOM are normal. Pupils are equal, round, and reactive to light.  Neck: Neck supple. No JVD present. No tracheal deviation present. No thyromegaly present.  Cardiovascular: Normal rate, regular rhythm and normal heart sounds.   Respiratory: Effort normal and breath sounds normal. No stridor.  GI: Soft. Bowel sounds are normal.  Lymphadenopathy:    He has no cervical adenopathy.  Neurological: He is alert and oriented to person, place, and time. He displays normal reflexes. No cranial nerve deficit. He exhibits abnormal muscle tone. Coordination normal.  Skin: Skin is warm and dry.  Psychiatric: He has a normal mood and affect. His behavior is normal. Judgment and thought content normal.     Assessment/Plan L3-4 stenosis possible pseudoarthrosis. For decompression, stablization.  Nyasha Rahilly J 07/26/2011, 9:55 PM

## 2011-07-27 ENCOUNTER — Inpatient Hospital Stay (HOSPITAL_COMMUNITY): Payer: No Typology Code available for payment source

## 2011-07-27 ENCOUNTER — Inpatient Hospital Stay (HOSPITAL_COMMUNITY)
Admission: RE | Admit: 2011-07-27 | Discharge: 2011-07-29 | DRG: 460 | Disposition: A | Discharge status: Home / Self Care | Payer: No Typology Code available for payment source | Source: Ambulatory Visit | Attending: Neurological Surgery | Admitting: Neurological Surgery

## 2011-07-27 ENCOUNTER — Inpatient Hospital Stay (HOSPITAL_COMMUNITY): Payer: No Typology Code available for payment source | Admitting: Anesthesiology

## 2011-07-27 ENCOUNTER — Encounter (HOSPITAL_COMMUNITY): Payer: Self-pay | Admitting: Anesthesiology

## 2011-07-27 ENCOUNTER — Encounter (HOSPITAL_COMMUNITY)
Admission: RE | Disposition: A | Discharge status: Home / Self Care | Payer: Self-pay | Source: Ambulatory Visit | Attending: Neurological Surgery

## 2011-07-27 DIAGNOSIS — Y831 Surgical operation with implant of artificial internal device as the cause of abnormal reaction of the patient, or of later complication, without mention of misadventure at the time of the procedure: Secondary | ICD-10-CM | POA: Diagnosis present

## 2011-07-27 DIAGNOSIS — S32009K Unspecified fracture of unspecified lumbar vertebra, subsequent encounter for fracture with nonunion: Secondary | ICD-10-CM

## 2011-07-27 DIAGNOSIS — T84498A Other mechanical complication of other internal orthopedic devices, implants and grafts, initial encounter: Principal | ICD-10-CM | POA: Diagnosis present

## 2011-07-27 DIAGNOSIS — F17200 Nicotine dependence, unspecified, uncomplicated: Secondary | ICD-10-CM | POA: Diagnosis present

## 2011-07-27 DIAGNOSIS — J449 Chronic obstructive pulmonary disease, unspecified: Secondary | ICD-10-CM | POA: Diagnosis present

## 2011-07-27 DIAGNOSIS — J4489 Other specified chronic obstructive pulmonary disease: Secondary | ICD-10-CM | POA: Diagnosis present

## 2011-07-27 DIAGNOSIS — Z01812 Encounter for preprocedural laboratory examination: Secondary | ICD-10-CM

## 2011-07-27 SURGERY — POSTERIOR LUMBAR FUSION
Anesthesia: General | Wound class: Clean

## 2011-07-27 MED ORDER — SODIUM CHLORIDE 0.9 % IV SOLN
INTRAVENOUS | Status: AC
  Filled 2011-07-27: qty 500

## 2011-07-27 MED ORDER — TRAZODONE HCL 50 MG PO TABS
50.0000 mg | ORAL_TABLET | Freq: Every evening | ORAL | Status: DC | PRN
  Filled 2011-07-27: qty 1

## 2011-07-27 MED ORDER — ROCURONIUM BROMIDE 100 MG/10ML IV SOLN
INTRAVENOUS | Status: DC | PRN
  Administered 2011-07-27: 30 mg via INTRAVENOUS
  Administered 2011-07-27: 50 mg via INTRAVENOUS
  Administered 2011-07-27: 20 mg via INTRAVENOUS
  Administered 2011-07-27: 50 mg via INTRAVENOUS

## 2011-07-27 MED ORDER — ONDANSETRON HCL 4 MG/2ML IJ SOLN
4.0000 mg | INTRAMUSCULAR | Status: DC | PRN
  Filled 2011-07-27: qty 2

## 2011-07-27 MED ORDER — GLYCOPYRROLATE 0.2 MG/ML IJ SOLN
INTRAMUSCULAR | Status: DC | PRN
  Administered 2011-07-27: .6 mg via INTRAVENOUS

## 2011-07-27 MED ORDER — VECURONIUM BROMIDE 10 MG IV SOLR
INTRAVENOUS | Status: DC | PRN
  Administered 2011-07-27: 2 mg via INTRAVENOUS

## 2011-07-27 MED ORDER — MIDAZOLAM HCL 5 MG/5ML IJ SOLN
INTRAMUSCULAR | Status: DC | PRN
  Administered 2011-07-27: 2 mg via INTRAVENOUS

## 2011-07-27 MED ORDER — TIOTROPIUM BROMIDE MONOHYDRATE 18 MCG IN CAPS
18.0000 ug | ORAL_CAPSULE | Freq: Every day | RESPIRATORY_TRACT | Status: DC
  Administered 2011-07-28 – 2011-07-29 (×2): 18 ug via RESPIRATORY_TRACT
  Filled 2011-07-27: qty 5

## 2011-07-27 MED ORDER — MENTHOL 3 MG MT LOZG
1.0000 | LOZENGE | OROMUCOSAL | Status: DC | PRN
  Filled 2011-07-27: qty 9

## 2011-07-27 MED ORDER — HYDROMORPHONE HCL PF 1 MG/ML IJ SOLN
INTRAMUSCULAR | Status: AC
  Filled 2011-07-27: qty 1

## 2011-07-27 MED ORDER — NEOSTIGMINE METHYLSULFATE 1 MG/ML IJ SOLN
INTRAMUSCULAR | Status: DC | PRN
  Administered 2011-07-27: 4 mg via INTRAVENOUS

## 2011-07-27 MED ORDER — ACETAMINOPHEN 650 MG RE SUPP: 650.0000 mg | RECTAL | Status: DC | PRN

## 2011-07-27 MED ORDER — MORPHINE SULFATE 2 MG/ML IJ SOLN: 0.0500 mg/kg | INTRAMUSCULAR | Status: DC | PRN

## 2011-07-27 MED ORDER — OXYCODONE HCL 15 MG PO TB12
30.0000 mg | ORAL_TABLET | Freq: Three times a day (TID) | ORAL | Status: DC
  Administered 2011-07-27 – 2011-07-28 (×3): 30 mg via ORAL
  Filled 2011-07-27 (×3): qty 2

## 2011-07-27 MED ORDER — HYDROMORPHONE HCL PF 1 MG/ML IJ SOLN
0.2500 mg | INTRAMUSCULAR | Status: DC | PRN
  Administered 2011-07-27: 0.25 mg via INTRAVENOUS
  Administered 2011-07-27: 0.5 mg via INTRAVENOUS
  Administered 2011-07-27: 0.25 mg via INTRAVENOUS

## 2011-07-27 MED ORDER — BUDESONIDE-FORMOTEROL FUMARATE 80-4.5 MCG/ACT IN AERO
2.0000 | INHALATION_SPRAY | Freq: Two times a day (BID) | RESPIRATORY_TRACT | Status: DC
  Administered 2011-07-28 – 2011-07-29 (×3): 2 via RESPIRATORY_TRACT
  Filled 2011-07-27: qty 6.9

## 2011-07-27 MED ORDER — PROPOFOL 10 MG/ML IV EMUL
INTRAVENOUS | Status: DC | PRN
  Administered 2011-07-27: 170 mg via INTRAVENOUS
  Administered 2011-07-27 (×2): 30 mg via INTRAVENOUS

## 2011-07-27 MED ORDER — THROMBIN 20000 UNITS EX KIT
PACK | CUTANEOUS | Status: DC | PRN
  Administered 2011-07-27: 20000 [IU] via TOPICAL

## 2011-07-27 MED ORDER — CEFAZOLIN SODIUM 1-5 GM-% IV SOLN
1.0000 g | Freq: Three times a day (TID) | INTRAVENOUS | Status: AC
  Administered 2011-07-27 (×2): 1 g via INTRAVENOUS
  Filled 2011-07-27 (×2): qty 50

## 2011-07-27 MED ORDER — METHOCARBAMOL 500 MG PO TABS
500.0000 mg | ORAL_TABLET | Freq: Four times a day (QID) | ORAL | Status: DC | PRN
  Filled 2011-07-27: qty 1

## 2011-07-27 MED ORDER — MORPHINE SULFATE 4 MG/ML IJ SOLN
1.0000 mg | INTRAMUSCULAR | Status: DC | PRN
  Administered 2011-07-27 – 2011-07-28 (×2): 4 mg via INTRAVENOUS
  Filled 2011-07-27 (×2): qty 1

## 2011-07-27 MED ORDER — MORPHINE SULFATE 2 MG/ML IJ SOLN
INTRAMUSCULAR | Status: AC
  Administered 2011-07-27: 2 mg via INTRAVENOUS
  Filled 2011-07-27: qty 1

## 2011-07-27 MED ORDER — MOMETASONE FURO-FORMOTEROL FUM 100-5 MCG/ACT IN AERO: 2.0000 | INHALATION_SPRAY | Freq: Two times a day (BID) | RESPIRATORY_TRACT | Status: DC

## 2011-07-27 MED ORDER — SODIUM CHLORIDE 0.9 % IV SOLN
250.0000 mL | INTRAVENOUS | Status: DC
  Administered 2011-07-28: 250 mL via INTRAVENOUS

## 2011-07-27 MED ORDER — BUTALBITAL-APAP-CAFFEINE 50-500-40 MG PO CAPS: 1.0000 | ORAL_CAPSULE | ORAL | Status: DC | PRN

## 2011-07-27 MED ORDER — PHENOL 1.4 % MT LIQD: 1.0000 | OROMUCOSAL | Status: DC | PRN

## 2011-07-27 MED ORDER — CEFAZOLIN SODIUM 1-5 GM-% IV SOLN
INTRAVENOUS | Status: AC
  Filled 2011-07-27: qty 50

## 2011-07-27 MED ORDER — LIDOCAINE HCL (CARDIAC) 20 MG/ML IV SOLN
INTRAVENOUS | Status: DC | PRN
  Administered 2011-07-27: 80 mg via INTRAVENOUS

## 2011-07-27 MED ORDER — ALUM & MAG HYDROXIDE-SIMETH 200-200-20 MG/5ML PO SUSP: 30.0000 mL | Freq: Four times a day (QID) | ORAL | Status: DC | PRN

## 2011-07-27 MED ORDER — BACITRACIN 50000 UNITS IM SOLR
INTRAMUSCULAR | Status: AC
  Filled 2011-07-27: qty 1

## 2011-07-27 MED ORDER — DIAZEPAM 5 MG PO TABS
5.0000 mg | ORAL_TABLET | Freq: Four times a day (QID) | ORAL | Status: DC
  Administered 2011-07-27 – 2011-07-29 (×9): 5 mg via ORAL
  Filled 2011-07-27 (×8): qty 1

## 2011-07-27 MED ORDER — SODIUM CHLORIDE 0.9 % IJ SOLN: 3.0000 mL | INTRAMUSCULAR | Status: DC | PRN

## 2011-07-27 MED ORDER — METHOCARBAMOL 100 MG/ML IJ SOLN
500.0000 mg | Freq: Four times a day (QID) | INTRAVENOUS | Status: DC | PRN
  Filled 2011-07-27 (×2): qty 5

## 2011-07-27 MED ORDER — FENTANYL CITRATE 0.05 MG/ML IJ SOLN
INTRAMUSCULAR | Status: DC | PRN
  Administered 2011-07-27 (×3): 50 ug via INTRAVENOUS
  Administered 2011-07-27: 150 ug via INTRAVENOUS
  Administered 2011-07-27: 50 ug via INTRAVENOUS
  Administered 2011-07-27: 100 ug via INTRAVENOUS

## 2011-07-27 MED ORDER — 0.9 % SODIUM CHLORIDE (POUR BTL) OPTIME
TOPICAL | Status: DC | PRN
  Administered 2011-07-27: 1000 mL

## 2011-07-27 MED ORDER — LACTATED RINGERS IV SOLN
INTRAVENOUS | Status: DC | PRN
  Administered 2011-07-27 (×3): via INTRAVENOUS

## 2011-07-27 MED ORDER — BUPIVACAINE HCL (PF) 0.5 % IJ SOLN
INTRAMUSCULAR | Status: DC | PRN
  Administered 2011-07-27: 30 mL

## 2011-07-27 MED ORDER — OXYCODONE-ACETAMINOPHEN 5-325 MG PO TABS
1.0000 | ORAL_TABLET | ORAL | Status: DC | PRN
  Administered 2011-07-27 – 2011-07-28 (×5): 2 via ORAL
  Administered 2011-07-28: 1 via ORAL
  Administered 2011-07-29 (×3): 2 via ORAL
  Filled 2011-07-27 (×8): qty 2

## 2011-07-27 MED ORDER — HEMOSTATIC AGENTS (NO CHARGE) OPTIME
TOPICAL | Status: DC | PRN
  Administered 2011-07-27: 1 via TOPICAL

## 2011-07-27 MED ORDER — SODIUM CHLORIDE 0.9 % IR SOLN
Status: DC | PRN
  Administered 2011-07-27: 09:00:00

## 2011-07-27 MED ORDER — ACETAMINOPHEN 325 MG PO TABS: 650.0000 mg | ORAL_TABLET | ORAL | Status: DC | PRN

## 2011-07-27 MED ORDER — SODIUM CHLORIDE 0.9 % IJ SOLN
3.0000 mL | Freq: Two times a day (BID) | INTRAMUSCULAR | Status: DC
  Administered 2011-07-27 – 2011-07-28 (×2): 3 mL via INTRAVENOUS

## 2011-07-27 MED ORDER — PREGABALIN 75 MG PO CAPS
150.0000 mg | ORAL_CAPSULE | Freq: Two times a day (BID) | ORAL | Status: DC
  Administered 2011-07-27 – 2011-07-29 (×4): 150 mg via ORAL
  Filled 2011-07-27 (×5): qty 2

## 2011-07-27 MED ORDER — DOXYCYCLINE HYCLATE 100 MG PO TABS
100.0000 mg | ORAL_TABLET | Freq: Two times a day (BID) | ORAL | Status: DC
  Administered 2011-07-27 – 2011-07-29 (×5): 100 mg via ORAL
  Filled 2011-07-27 (×6): qty 1

## 2011-07-27 SURGICAL SUPPLY — 67 items
ADH SKN CLS APL DERMABOND .7 (GAUZE/BANDAGES/DRESSINGS) ×2
ADH SKN CLS LQ APL DERMABOND (GAUZE/BANDAGES/DRESSINGS) ×2
BAG DECANTER FOR FLEXI CONT (MISCELLANEOUS) ×3 IMPLANT
BLADE SURG ROTATE 9660 (MISCELLANEOUS) IMPLANT
BUR MATCHSTICK NEURO 3.0 LAGG (BURR) ×3 IMPLANT
CANISTER SUCTION 2500CC (MISCELLANEOUS) ×3 IMPLANT
CLOTH BEACON ORANGE TIMEOUT ST (SAFETY) ×3 IMPLANT
CONT SPEC 4OZ CLIKSEAL STRL BL (MISCELLANEOUS) ×6 IMPLANT
COVER BACK TABLE 24X17X13 BIG (DRAPES) IMPLANT
COVER TABLE BACK 60X90 (DRAPES) ×3 IMPLANT
DECANTER SPIKE VIAL GLASS SM (MISCELLANEOUS) ×3 IMPLANT
DERMABOND ADHESIVE PROPEN (GAUZE/BANDAGES/DRESSINGS) ×1
DERMABOND ADVANCED (GAUZE/BANDAGES/DRESSINGS) ×1
DERMABOND ADVANCED .7 DNX12 (GAUZE/BANDAGES/DRESSINGS) ×2 IMPLANT
DERMABOND ADVANCED .7 DNX6 (GAUZE/BANDAGES/DRESSINGS) ×1 IMPLANT
DRAPE C-ARM 42X72 X-RAY (DRAPES) ×6 IMPLANT
DRAPE LAPAROTOMY 100X72X124 (DRAPES) ×3 IMPLANT
DRAPE POUCH INSTRU U-SHP 10X18 (DRAPES) ×3 IMPLANT
DRAPE PROXIMA HALF (DRAPES) IMPLANT
DURAPREP 26ML APPLICATOR (WOUND CARE) ×3 IMPLANT
ELECT REM PT RETURN 9FT ADLT (ELECTROSURGICAL) ×3
ELECTRODE REM PT RTRN 9FT ADLT (ELECTROSURGICAL) ×2 IMPLANT
GAUZE SPONGE 4X4 16PLY XRAY LF (GAUZE/BANDAGES/DRESSINGS) ×2 IMPLANT
GLOVE BIOGEL PI IND STRL 7.0 (GLOVE) ×1 IMPLANT
GLOVE BIOGEL PI IND STRL 8 (GLOVE) ×1 IMPLANT
GLOVE BIOGEL PI IND STRL 8.5 (GLOVE) ×5 IMPLANT
GLOVE BIOGEL PI INDICATOR 7.0 (GLOVE) ×1
GLOVE BIOGEL PI INDICATOR 8 (GLOVE) ×1
GLOVE BIOGEL PI INDICATOR 8.5 (GLOVE) ×3
GLOVE ECLIPSE 7.0 STRL STRAW (GLOVE) ×2 IMPLANT
GLOVE ECLIPSE 8.5 STRL (GLOVE) ×6 IMPLANT
GLOVE EXAM NITRILE LRG STRL (GLOVE) IMPLANT
GLOVE EXAM NITRILE MD LF STRL (GLOVE) ×2 IMPLANT
GLOVE EXAM NITRILE XL STR (GLOVE) IMPLANT
GLOVE EXAM NITRILE XS STR PU (GLOVE) IMPLANT
GLOVE SS BIOGEL STRL SZ 6.5 (GLOVE) ×2 IMPLANT
GLOVE SUPERSENSE BIOGEL SZ 6.5 (GLOVE) ×2
GLOVE SURG SS PI 8.0 STRL IVOR (GLOVE) ×6 IMPLANT
GOWN BRE IMP SLV AUR LG STRL (GOWN DISPOSABLE) ×2 IMPLANT
GOWN BRE IMP SLV AUR XL STRL (GOWN DISPOSABLE) IMPLANT
GOWN STRL REIN 2XL LVL4 (GOWN DISPOSABLE) ×8 IMPLANT
KIT BASIN OR (CUSTOM PROCEDURE TRAY) ×3 IMPLANT
KIT ROOM TURNOVER OR (KITS) ×3 IMPLANT
NEEDLE HYPO 22GX1.5 SAFETY (NEEDLE) ×3 IMPLANT
NS IRRIG 1000ML POUR BTL (IV SOLUTION) ×3 IMPLANT
PACK FOAM VITOSS 5CC (Orthopedic Implant) ×2 IMPLANT
PACK LAMINECTOMY NEURO (CUSTOM PROCEDURE TRAY) ×3 IMPLANT
PAD ARMBOARD 7.5X6 YLW CONV (MISCELLANEOUS) ×9 IMPLANT
PATTIES SURGICAL .5 X1 (DISPOSABLE) ×1 IMPLANT
ROD 35MM (Rod) ×4 IMPLANT
SCREW 35MM PEDICLE (Screw) ×4 IMPLANT
SCREW POLYAX 6.5X45MM (Screw) ×4 IMPLANT
SCREW SET SPINAL STD HEXALOBE (Screw) ×8 IMPLANT
SPONGE GAUZE 4X4 12PLY (GAUZE/BANDAGES/DRESSINGS) ×3 IMPLANT
SPONGE LAP 4X18 X RAY DECT (DISPOSABLE) IMPLANT
SPONGE SURGIFOAM ABS GEL 100 (HEMOSTASIS) ×3 IMPLANT
SUT VIC AB 1 CT1 18XBRD ANBCTR (SUTURE) ×3 IMPLANT
SUT VIC AB 1 CT1 8-18 (SUTURE) ×6
SUT VIC AB 2-0 CP2 18 (SUTURE) ×5 IMPLANT
SUT VIC AB 3-0 SH 8-18 (SUTURE) ×5 IMPLANT
SYR 20ML ECCENTRIC (SYRINGE) ×3 IMPLANT
TAPE CLOTH SURG 4X10 WHT LF (GAUZE/BANDAGES/DRESSINGS) ×2 IMPLANT
TOWEL OR 17X24 6PK STRL BLUE (TOWEL DISPOSABLE) ×3 IMPLANT
TOWEL OR 17X26 10 PK STRL BLUE (TOWEL DISPOSABLE) ×3 IMPLANT
TRAP SPECIMEN MUCOUS 40CC (MISCELLANEOUS) ×3 IMPLANT
TRAY FOLEY CATH 14FRSI W/METER (CATHETERS) ×3 IMPLANT
WATER STERILE IRR 1000ML POUR (IV SOLUTION) ×3 IMPLANT

## 2011-07-27 NOTE — Anesthesia Postprocedure Evaluation (Signed)
  Anesthesia Post-op Note  Patient: [name]  Procedure(s) Performed: Procedure(s) (LRB): POSTERIOR LUMBAR FUSION ()  Patient Location: PACU  Anesthesia Type: General  Level of Consciousness: awake  Airway and Oxygen Therapy: Patient Spontanous Breathing  Post-op Pain: mild  Post-op Assessment: Post-op Vital signs reviewed  Post-op Vital Signs: Reviewed  Complications: No apparent anesthesia complications

## 2011-07-27 NOTE — Transfer of Care (Signed)
Immediate Anesthesia Transfer of Care Note  Patient: [name]  Procedure(s) Performed: Procedure(s) (LRB): POSTERIOR LUMBAR FUSION ()  Patient Location: PACU  Anesthesia Type: General  Level of Consciousness: awake, alert  and patient cooperative  Airway & Oxygen Therapy: Patient Spontanous Breathing and Patient connected to face mask oxygen  Post-op Assessment: Report given to PACU RN  Post vital signs: Reviewed and stable  Complications: No apparent anesthesia complications

## 2011-07-27 NOTE — Op Note (Signed)
Preoperative diagnosis: L3-4 pseudoarthrosis, status post anterior lumbar interbody arthrodesis L3-4 with severe lumbar spinal stenosis. Status post posterior decompression arthrodesis L4 to sacrum Postoperative diagnosis: L3-4 pseudoarthrosis, status post anterior lumbar interbody arthrodesis L3-4 with severe lumbar spinal stenosis. Status post posterior decompression arthrodesis L4 to sacrum. Procedure: Posterior decompression L3-4 including L3 and L4 nerve roots. Removal of  previously placed hardware L4 to sacrum exploration of arthrodesis. Pedicle screw fixation L3-L4 with posterior lateral arthrodesis using autograft and allograft Surgeon: Barnett Abu M.D. Assistant: Shirlean Kelly M.D. Anesthesia: Gen. endotracheal Indications: Patient is a 56 year old individual's had significant back and bilateral lower extremity pain worse on the left on the right he has severe radicular pain was found to have a high-grade stenosis at L3-L4 as previously treated with an anterior lumbar interbody arthrodesis. Is felt that this had fused well however clearly the patient was able to demonstrate some motion across the segment and now had what appeared to be a large osteophytic ridge causing severe central and left lateral recess stenosis at L3-L4. Is advised regarding the need for surgical decompression and arthrodesis with probable removal of his previously placed hardware.  Procedure: The patient was brought to the operating room supine on a stretcher after the smooth induction of general endotracheal anesthesia he was turned prone. The back was prepped with alcohol and DuraPrep and draped in a sterile fashion. An elliptical incision was made around this previous scar and this tissue was excised. The dissection was carried down through the lumbar dorsal fascia to expose the hardware from L4 to the sacrum. This was noted to be expedient hardware. The exact walls were not able to be had however using a universal  removal set her able to loosen the screws and gradually remove all the screws although this took a good deal of manipulation of the hardware in order to loosen and remove it completely.  Then laminotomies were created around the L3-L4 and the common dural tube and the L4 nerve roots were decompressed first on either side and then more proximal decompression was performed  for the L3 nerve root. On the left side there was noted to be a substantial bony osteophytes with some disc material trapped causing left lateral recess stenosis particularly most severe for the L4 nerve root area this mass was decompressed the dissection was carried cephalad to expose foraminotomies for the L3 nerve root and this was facilitated by removing the superior articular process of L4. A similar process was carried out on the right side however here there was no substantial osteophyte or disc herniation noted. With the L3 and the L4 nerve roots being well decompressed pedicle entry sites were chosen using a medial to lateral trajectory at L3 this vertebrae was secured with 5.5 x 35 mm screws. 6.5 x 45 mm screws were used at L4. Short 35mm precontoured rods were used to connect the screw heads and these were tightened in a neutral fashion down to the require torque.  The posterior lateral gutters were decompressed and these were packed with bone graft which included the autograft and allograft that had been previously obtained. Once this was completed the retractors were removed and final radiographs were obtained demonstrating the hardware at L3-L4 and a neutral construct. The incision was then copiously irrigated with antibiotic irrigating solution and the wound was closed in layers with #1 Vicryl in the lumbar dorsal fascia 2-0 Vicryl subcutaneous tissues and 3-0 Vicryl subcuticularly blood loss for the procedure was estimated at around 400 cc  and 100 cc of Cell Saver blood was returned to the patient.

## 2011-07-27 NOTE — Progress Notes (Signed)
PHARMACIST - PHYSICIAN ORDER COMMUNICATION  CONCERNING: P&T Medication Policy on Herbal Medications  DESCRIPTION:  This patient's order for: ACETAMINOPHEN-CAFF-BUTALBITAL 50-500-40 MG CAPS has been noted. Patient was on this medication prior to admission  q 4 hours prn. We do not carry this product in patient, and the patient is currently also on acetaminophen 650 mg q4hrs PRN     ACTION TAKEN: The pharmacy department is unable to verify this order at this time and your patient has been informed of this safety policy. Please reevaluate patient's clinical condition at discharge and address if the herbal or natural product(s) should be resumed at that time.  Bayard Hugger, PharmD, BCPS, [phone]

## 2011-07-27 NOTE — OR Nursing (Signed)
There was a stick incident with a 15 blade to [employer], ST. Paper work was filled out for employee health, blood from pt was drawn, and a safety zone portal was filled out.

## 2011-07-27 NOTE — Preoperative (Signed)
Beta Blockers   Reason not to administer Beta Blockers:Not Applicable 

## 2011-07-27 NOTE — Anesthesia Procedure Notes (Addendum)
Performed by: Sharlene Dory E   Procedure Name: Intubation Date/Time: 07/27/2011 7:54 AM Performed by: Quentin Ore Pre-anesthesia Checklist: Patient identified, Emergency Drugs available, Suction available, Patient being monitored and Timeout performed Patient Re-evaluated:Patient Re-evaluated prior to inductionOxygen Delivery Method: Circle system utilized Preoxygenation: Pre-oxygenation with 100% oxygen Intubation Type: IV induction Ventilation: Mask ventilation without difficulty Laryngoscope Size: Mac Grade View: Grade II Tube type: Oral Tube size: 8.0 mm Number of attempts: 1 Airway Equipment and Method: Stylet Placement Confirmation: ETT inserted through vocal cords under direct vision,  positive ETCO2 and breath sounds checked- equal and bilateral Secured at: 23 cm Tube secured with: Tape Dental Injury: Teeth and Oropharynx as per pre-operative assessment

## 2011-07-27 NOTE — Anesthesia Preprocedure Evaluation (Addendum)
Anesthesia Evaluation  Patient identified by MRN, date of birth, ID band Patient awake    Reviewed: Allergy & Precautions, H&P , NPO status , Patient's Chart, lab work & pertinent test results  Airway Mallampati: II      Dental   Pulmonary shortness of breath and at rest, sleep apnea , COPDCurrent Smoker,  breath sounds clear to auscultation        Cardiovascular negative cardio ROS  Rhythm:Regular Rate:Normal     Neuro/Psych  Headaches, Anxiety    GI/Hepatic negative GI ROS, Neg liver ROS,   Endo/Other  negative endocrine ROS  Renal/GU negative Renal ROS     Musculoskeletal negative musculoskeletal ROS (+)   Abdominal   Peds  Hematology negative hematology ROS (+)   Anesthesia Other Findings   Reproductive/Obstetrics                          Anesthesia Physical Anesthesia Plan  ASA: III  Anesthesia Plan: General   Post-op Pain Management:    Induction: Intravenous  Airway Management Planned: Oral ETT  Additional Equipment:   Intra-op Plan:   Post-operative Plan:   Informed Consent: I have reviewed the patients History and Physical, chart, labs and discussed the procedure including the risks, benefits and alternatives for the proposed anesthesia with the patient or authorized representative who has indicated his/her understanding and acceptance.     Plan Discussed with: CRNA  Anesthesia Plan Comments:         Anesthesia Quick Evaluation

## 2011-07-28 LAB — CBC
HCT: 43.2 % (ref 39.0–52.0)
Hemoglobin: 14.4 g/dL (ref 13.0–17.0)
MCV: 95.2 fL (ref 78.0–100.0)
RDW: 14.4 % (ref 11.5–15.5)
WBC: 8.6 10*3/uL (ref 4.0–10.5)

## 2011-07-28 LAB — BASIC METABOLIC PANEL
BUN: 8 mg/dL (ref 6–23)
Chloride: 102 mEq/L (ref 96–112)
Creatinine, Ser: 0.73 mg/dL (ref 0.50–1.35)
GFR calc Af Amer: >90 mL/min (ref >90)
Glucose, Bld: 120 mg/dL — ABNORMAL HIGH (ref 70–99)
Potassium: 4.3 mEq/L (ref 3.5–5.1)

## 2011-07-28 MED ORDER — KETOROLAC TROMETHAMINE 15 MG/ML IJ SOLN
15.0000 mg | Freq: Three times a day (TID) | INTRAMUSCULAR | Status: DC
  Filled 2011-07-28 (×3): qty 1

## 2011-07-28 MED ORDER — KETOROLAC TROMETHAMINE 15 MG/ML IJ SOLN
30.0000 mg | Freq: Three times a day (TID) | INTRAMUSCULAR | Status: DC
  Filled 2011-07-28 (×3): qty 2

## 2011-07-28 MED ORDER — KETOROLAC TROMETHAMINE 30 MG/ML IJ SOLN
30.0000 mg | Freq: Three times a day (TID) | INTRAMUSCULAR | Status: DC
  Administered 2011-07-28 – 2011-07-29 (×3): 30 mg via INTRAVENOUS
  Filled 2011-07-28 (×4): qty 1

## 2011-07-28 MED ORDER — KETOROLAC TROMETHAMINE 30 MG/ML IJ SOLN
INTRAMUSCULAR | Status: AC
  Administered 2011-07-28: 30 mg
  Filled 2011-07-28: qty 1

## 2011-07-28 MED ORDER — OXYCODONE HCL 40 MG PO TB12
40.0000 mg | ORAL_TABLET | Freq: Three times a day (TID) | ORAL | Status: DC
  Administered 2011-07-28 – 2011-07-29 (×4): 40 mg via ORAL
  Filled 2011-07-28 (×3): qty 1

## 2011-07-28 NOTE — Progress Notes (Signed)
PT NOTE: Pt just back to bed, will complete evaluation in a few hours after pt has rested.   [name] ([name]) [name] PT, DPT Acute Rehabilitation [phone]

## 2011-07-28 NOTE — Progress Notes (Signed)
Pt did walk till the door. Tolerated fair. Severe pain on back. Thanks

## 2011-07-28 NOTE — Progress Notes (Signed)
Patient ID: [name], male   DOB: May 10, 1955, 56 y.o.   MRN: [id number] Patient complains of diffuse pain and back and lower extremities. Incision is clean and dry. Dressing intact. Motor function intact in lower extremities. Patient is on home medication replacement including OxyContin 30 mg 3 times a day. We'll add Toradol. We'll increase OxyContin to 40 mg 3 times a day area to

## 2011-07-28 NOTE — Progress Notes (Signed)
CSW received consult for SNF. Currently, PT is recommending home health PT. RNCM is aware and following. CSW consulted with MD regarding discharge plan. Per MD, pt will discharge home. No further clinical social work needs identified. Please reconsult if a clinical social work need arises prior to discharge.   Dede Query, MSW, Theresia Majors [phone]

## 2011-07-28 NOTE — Evaluation (Signed)
Physical Therapy Evaluation Patient Details Name: [name] MRN: [id number] DOB: [birth year] Today's Date: 07/28/2011  Problem List: There is no problem list on file for this patient.   Past Medical History:  Past Medical History  Diagnosis Date  . Normal cardiac stress test     2005, done prior to back surgery  . COPD (chronic obstructive pulmonary disease)   . Sleep apnea     study- 2012[facility] , Dr. Blenda Nicely, [address]  , uses CPAP  qo night   . Headache     uses excedrin prn  . Neuromuscular disorder     pseudoarthrosis   . Arthritis     lumbar stenosis   . Anxiety     went off medicines for anxiety 6 months ago ( 12/2010), pt. "stays nervous", per pt.   . Cysts     groin area (on & off- for 4 +yrs.) for which he uses Doxycyline on going, BID, Rx fr. Dr. Fatima Blank in [place], [place].,    . History of normal resting EKG     2009, preop  . Finger infection     05/2011, L hand, index finger  - I&D in MD office, treated /w antibiotic.Marland KitchenMarland KitchenMarland Kitchen?name  . Shortness of breath     pt. reports related to pain    Past Surgical History:  Past Surgical History  Procedure Date  . Back surgery     multiple, lumbar & cerv. fusions, last low back- 2009  . Hemorroidectomy     2012[facility]  . Appendectomy     as a teen     PT Assessment/Plan/Recommendation PT Assessment Clinical Impression Statement: 56 year old male admitted s/p posterior lumbar fusion. Pt presents to therapy with decreased functional mobility and pain. Will benefit from acute physical therapy to return to prior level of function. Pt may benefit from HHPT pending progress, will need to perform stairs with RW prior to D/C hoome.  PT Recommendation/Assessment: Patient will need skilled PT in the acute care venue PT Problem List: Decreased strength;Decreased activity tolerance;Decreased balance;Decreased mobility;Decreased knowledge of use of DME;Decreased knowledge of precautions;Pain Barriers to  Discharge: None PT Therapy Diagnosis : Difficulty walking;Abnormality of gait;Generalized weakness;Acute pain PT Plan PT Frequency: Min 5X/week PT Treatment/Interventions: DME instruction;Gait training;Stair training;Functional mobility training;Therapeutic activities;Therapeutic exercise;Balance training;Patient/family education PT Recommendation Follow Up Recommendations: Home health PT;Supervision for mobility/OOB (pending progress) Equipment Recommended: None recommended by PT PT Goals  Acute Rehab PT Goals PT Goal Formulation: With patient Time For Goal Achievement: 7 days Pt will Roll Supine to Right Side: with modified independence PT Goal: Rolling Supine to Right Side - Progress: Goal set today Pt will Roll Supine to Left Side: with modified independence PT Goal: Rolling Supine to Left Side - Progress: Goal set today Pt will go Supine/Side to Sit: with modified independence PT Goal: Supine/Side to Sit - Progress: Goal set today Pt will go Sit to Supine/Side: with modified independence PT Goal: Sit to Supine/Side - Progress: Goal set today Pt will go Sit to Stand: with modified independence PT Goal: Sit to Stand - Progress: Goal set today Pt will go Stand to Sit: with modified independence PT Goal: Stand to Sit - Progress: Goal set today Pt will Ambulate: >150 feet;with supervision;with rolling walker PT Goal: Ambulate - Progress: Goal set today Pt will Go Up / Down Stairs: 3-5 stairs;with supervision;with rolling walker (no rails) PT Goal: Up/Down Stairs - Progress: Goal set today Additional Goals Additional Goal #1: Verbalize 3/3  back precautions and demonstrate appropriate use during treatment session with </= 1 verbal cues PT Goal: Additional Goal #1 - Progress: Goal set today  PT Evaluation Precautions/Restrictions  Precautions Precautions: Back Precaution Booklet Issued: Yes (comment) (handout given) Required Braces or Orthoses: No Restrictions Weight Bearing  Restrictions: No Prior Functioning  Home Living Lives With: Spouse;Son Type of Home: Mobile home Home Layout: One level Home Access: Stairs to enter Entrance Stairs-Rails: None Entrance Stairs-Number of Steps: 3 Bathroom Shower/Tub: Psychologist, counselling;Door (Built in seat) Firefighter: Handicapped height Bathroom Accessibility: Yes Home Adaptive Equipment: Walker - rolling;Bedside commode/3-in-1;Grab bars in shower;Straight cane Prior Function Level of Independence: Requires assistive device for independence;Independent with basic ADLs Vocation: On disability Comments: uses cane all the time, recently needed a RW at times.  Cognition Cognition Arousal/Alertness: Awake/alert Overall Cognitive Status: Appears within functional limits for tasks assessed Orientation Level: Oriented X4 Sensation/Coordination Sensation Light Touch: Appears Intact (in bilateral LEs) Coordination Gross Motor Movements are Fluid and Coordinated: Yes Fine Motor Movements are Fluid and Coordinated: Yes Extremity Assessment RLE Assessment RLE Assessment: Within Functional Limits (functional weakness noted) LLE Assessment LLE Assessment: Within Functional Limits (functional weakness noted) Mobility (including Balance) Bed Mobility Bed Mobility: Yes Rolling Right: Other (comment) (min-guard assist) Rolling Right Details (indicate cue type and reason): Tactile cues for back precautions.  Right Sidelying to Sit: With rails;HOB flat;5: Supervision Right Sidelying to Sit Details (indicate cue type and reason): Verbal cues for maintaining back precautions . Transfers Transfers: Yes Sit to Stand: With upper extremity assist;4: Min assist;From bed;From elevated surface Sit to Stand Details (indicate cue type and reason): Performed from height closer to bed at home although pt will need to continue to practice from low surfaces. Assist secondary to weakness and pain - pt required 3 attempts.  Stand to Sit: 5:  Supervision;With upper extremity assist;To chair/3-in-1;With armrests Stand to Sit Details: Verbal cues for safe UE placement and to control descent.  Ambulation/Gait Ambulation/Gait: Yes Ambulation/Gait Assistance: Other (comment) (min-guard assist) Ambulation/Gait Assistance Details (indicate cue type and reason): Verbal cues for upright posture. Pt with good distancing of RW. Encouraged pt to increase step length slightly.  Ambulation Distance (Feet): 150 Feet Assistive device: Rolling walker Gait Pattern: Step-through pattern;Decreased stride length;Trunk flexed (minimal foot clearance bilaterally) Stairs: No  Posture/Postural Control Posture/Postural Control: No significant limitations Balance Balance Assessed: Yes Static Standing Balance Static Standing - Balance Support: No upper extremity supported;During functional activity Static Standing - Level of Assistance: Other (comment) (min-guard assist) Static Standing - Comment/# of Minutes: No overt losses of balance, continues to need RW for safety and balance.  End of Session PT - End of Session Equipment Utilized During Treatment: Gait belt Activity Tolerance: Patient tolerated treatment well;Patient limited by fatigue Patient left: in chair;with call bell in reach;with family/visitor present Nurse Communication: Mobility status for ambulation General Behavior During Session: [facility] for tasks performed Cognition: [facility] for tasks performed  Wilhemina Bonito 07/28/2011, 3:01 PM  Sherie Don) [name] PT, DPT Acute Rehabilitation [phone]

## 2011-07-28 NOTE — Evaluation (Signed)
Occupational Therapy Evaluation Patient Details Name: [name] MRN: [id number] DOB: [birth year] Today's Date: 07/28/2011 8:22-8:37  evII Problem List: There is no problem list on file for this patient.   Past Medical History:  Past Medical History  Diagnosis Date  . Normal cardiac stress test     2005, done prior to back surgery  . COPD (chronic obstructive pulmonary disease)   . Sleep apnea     study- 2012[facility] , Dr. Blenda Nicely, [address]  , uses CPAP  qo night   . Headache     uses excedrin prn  . Neuromuscular disorder     pseudoarthrosis   . Arthritis     lumbar stenosis   . Anxiety     went off medicines for anxiety 6 months ago ( 12/2010), pt. "stays nervous", per pt.   . Cysts     groin area (on & off- for 4 +yrs.) for which he uses Doxycyline on going, BID, Rx fr. Dr. Fatima Blank in [place], [place].,    . History of normal resting EKG     2009, preop  . Finger infection     05/2011, L hand, index finger  - I&D in MD office, treated /w antibiotic.Marland KitchenMarland KitchenMarland Kitchen?name  . Shortness of breath     pt. reports related to pain    Past Surgical History:  Past Surgical History  Procedure Date  . Back surgery     multiple, lumbar & cerv. fusions, last low back- 2009  . Hemorroidectomy     2012[facility]  . Appendectomy     as a teen     OT Assessment/Plan/Recommendation OT Assessment Clinical Impression Statement: 56 year old male admitted for elective posterior L3-L4 lumbar fusion.  Currently needs at least min assist for mobility and selfcare tasks.  Will benefit from acute OT to address current deficits and help increase overall independence with basic selfcare tasks.  No anticipated follow-up post acute stay. OT Recommendation/Assessment: Patient will need skilled OT in the acute care venue OT Problem List: Decreased strength;Decreased activity tolerance;Decreased knowledge of use of DME or AE;Impaired balance (sitting and/or standing);Pain;Decreased  knowledge of precautions;Decreased safety awareness OT Therapy Diagnosis : Generalized weakness;Acute pain OT Plan OT Frequency: Min 2X/week OT Treatment/Interventions: Self-care/ADL training;DME and/or AE instruction;Balance training;Patient/family education;Therapeutic activities OT Recommendation Follow Up Recommendations: No OT follow up Equipment Recommended: None recommended by OT Individuals Consulted Consulted and Agree with Results and Recommendations: Patient;Family member/caregiver Family Member Consulted: Spouse OT Goals Acute Rehab OT Goals OT Goal Formulation: With patient/family Time For Goal Achievement: 7 days ADL Goals Pt Will Transfer to Toilet: with supervision;with DME;3-in-1;Maintaining back safety precautions ADL Goal: Toilet Transfer - Progress: Goal set today Pt Will Perform Tub/Shower Transfer: Shower transfer;Ambulation;with DME;Maintaining back safety precautions ADL Goal: Tub/Shower Transfer - Progress: Goal set today  OT Evaluation Precautions/Restrictions  Precautions Precautions: Back Precaution Booklet Issued: Yes (comment) Required Braces or Orthoses: No Restrictions Weight Bearing Restrictions: No Prior Functioning Home Living Lives With: Spouse;Son Type of Home: Mobile home Home Layout: One level Home Access: Stairs to enter Entrance Stairs-Rails: None Entrance Stairs-Number of Steps: 3 Bathroom Shower/Tub: Psychologist, counselling;Door (Built in seat) Firefighter: Handicapped height Bathroom Accessibility: Yes Home Adaptive Equipment: Walker - rolling;Bedside commode/3-in-1;Grab bars in shower;Straight cane Prior Function Level of Independence: Requires assistive device for independence;Independent with basic ADLs Vocation: On disability ADL ADL Eating/Feeding: Simulated;Independent Where Assessed - Eating/Feeding: Chair Grooming: Simulated;Supervision/safety Where Assessed - Grooming: Standing at sink Upper Body Bathing: Simulated;Set  up Where Assessed - Upper Body Bathing: Supported;Sitting, chair Lower Body Bathing: Simulated;Minimal assistance Where Assessed - Lower Body Bathing: Sit to stand from chair Upper Body Dressing: Simulated;Set up Where Assessed - Upper Body Dressing: Supported;Sitting, chair Lower Body Dressing: Simulated;Minimal assistance Where Assessed - Lower Body Dressing: Sit to stand from chair Toilet Transfer: Simulated;Minimal assistance Toilet Transfer Method: Ambulating Toilet Transfer Equipment: Raised toilet seat with arms (or 3-in-1 over toilet) Toileting - Clothing Manipulation: Simulated;Minimal assistance Where Assessed - Toileting Clothing Manipulation: Sit to stand from 3-in-1 or toilet Toileting - Hygiene: Simulated;Minimal assistance Where Assessed - Toileting Hygiene: Sit to stand from 3-in-1 or toilet Tub/Shower Transfer: Not assessed Tub/Shower Transfer Method: Not assessed Equipment Used: Rolling walker Ambulation Related to ADLs: Pt limited by pain this am.  Able to ambulate slowly with use of RW and min guard assist, only to the bathroom door and back to the bedside chair. ADL Comments: Pt familiar with reacher and already has one at home.  Demonstrated sock aide and let pt practice with as well.  Issued back  precautions handout.  Vision/Perception  Vision - History Baseline Vision: No visual deficits Patient Visual Report: No change from baseline Perception Perception: Within Functional Limits Praxis Praxis: Intact Cognition Cognition Arousal/Alertness: Awake/alert Overall Cognitive Status: Appears within functional limits for tasks assessed Orientation Level: Oriented X4 Sensation/Coordination Sensation Light Touch: Appears Intact (in bilateral UEs) Coordination Gross Motor Movements are Fluid and Coordinated: Yes Fine Motor Movements are Fluid and Coordinated: Yes Extremity Assessment RUE Assessment RUE Assessment: Within Functional Limits (Not formally assessed  secondary to back precautions.) LUE Assessment LUE Assessment: Within Functional Limits (Strength not formally assessed secondary to back precautions) Mobility  Bed Mobility Bed Mobility: Yes Rolling Right: 4: Min assist Rolling Right Details (indicate cue type and reason): Min instructional cueing for technique. Right Sidelying to Sit: With rails;HOB flat;5: Supervision Transfers Transfers: Yes Sit to Stand: With upper extremity assist;4: Min assist;From bed Sit to Stand Details (indicate cue type and reason): Pt grabbed and pulled up on walker even though therapist was cueing him to push up from the bed.   Stand to Sit: 5: Supervision;With upper extremity assist;To chair/3-in-1;With armrests  End of Session OT - End of Session Activity Tolerance: Patient limited by pain Patient left: in chair;with call bell in reach;with family/visitor present General Behavior During Session: [facility] for tasks performed Cognition: [facility] for tasks performed   Aubri Gathright OTR/L 07/28/2011, 10:43 AM  Pager number [phone]

## 2011-07-29 MED ORDER — OXYCODONE-ACETAMINOPHEN 5-325 MG PO TABS: 1.0000 | ORAL_TABLET | ORAL | Status: AC | PRN

## 2011-07-29 MED ORDER — DIAZEPAM 5 MG PO TABS: 5.0000 mg | ORAL_TABLET | Freq: Four times a day (QID) | ORAL | Status: AC | PRN

## 2011-07-29 MED ORDER — OXYCODONE HCL 40 MG PO TB12: 40.0000 mg | ORAL_TABLET | Freq: Three times a day (TID) | ORAL | Status: AC

## 2011-07-29 NOTE — Discharge Summary (Signed)
Physician Discharge Summary  Patient ID: [name] MRN: [id number] DOB/AGE: 11-05-1955 56 y.o.  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 *    Discharged Condition: good  Hospital Course: Patient was admitted to undergo surgical decompression of severe stenosis at L3-L4 having previously undergone anterior lumbar interbody arthrodesis at that level. He had no obvious evidence of a pseudoarthrosis other than the patient developed progressive back pain and leg pain in the myelogram and postmyelogram CAT scan and straight it severe stenosis at L3-L4. He did well postoperatively.  Consults: None  Significant Diagnostic Studies: Lumbar myelogram  Treatments: surgery: Posterior decompression L3-L4 removal of previously placed hardware at L4 to sacrum pedicle screw fixation L3-L4 with posterior lateral arthrodesis L3-L4  Discharge Exam: Blood pressure 105/71, pulse 82, temperature 97.1 F (36.2 C), temperature source Oral, resp. rate 18, height 5\' 8"  (1.727 m), weight 111 kg (244 lb 11.4 oz), SpO2 94.00%. Incision is clean and dry motor function is good in iliopsoas quadriceps tibialis anterior and gastrocs patient's posture is markedly improved  Disposition:  home  Discharge Orders    Future Orders Please Complete By Expires   Diet - low sodium heart healthy      Increase activity slowly      Discharge instructions      Comments:   Sit straight walk straight stand straight mind your posture. Avoid bending lifting and stooping. No lifting greater than 15 pounds. Use lumbar brace when out of bed.   Call MD for:  temperature >100.4      Call MD for:  severe uncontrolled pain      Call MD for:  redness, tenderness, or signs of infection (pain, swelling, redness, odor  or green/yellow discharge around incision site)        Medication List  As of 07/29/2011  7:19 PM   TAKE these medications         ACETAMINOPHEN-CAFF-BUTALBITAL 50-500-40 MG Caps   Take 1 tablet by mouth every 4 (four) hours as needed. For migraine      diazepam 5 MG tablet   Commonly known as: VALIUM   Take 5 mg by mouth 4 (four) times daily.      diazepam 5 MG tablet   Commonly known as: VALIUM   Take 1 tablet (5 mg total) by mouth every 6 (six) hours as needed for anxiety (Muscle spasm).      doxycycline 100 MG tablet   Commonly known as: VIBRA-TABS   Take 100 mg by mouth 2 (two) times daily.      mometasone-formoterol 100-5 MCG/ACT Aero   Commonly known as: DULERA   Inhale 2 puffs into the lungs 2 (two) times daily.      oxycodone 30 MG Tb12   Commonly known as: OXYCONTIN   Take 30 mg by mouth 3 (three) times daily.      oxyCODONE 40 MG 12 hr tablet   Commonly known as: OXYCONTIN   Take 1 tablet (40 mg total) by mouth every 8 (eight) hours.      oxyCODONE-acetaminophen 5-325 MG per tablet   Commonly known as: PERCOCET   Take 1-2 tablets by mouth every 4 (four) hours as needed for pain.      pregabalin 150 MG capsule   Commonly known as: LYRICA   Take 150 mg by mouth 2 (  two) times daily.      Tamsulosin HCl 0.4 MG Caps   Commonly known as: FLOMAX   Take 0.4 mg by mouth every morning.      tiotropium 18 MCG inhalation capsule   Commonly known as: SPIRIVA   Place 18 mcg into inhaler and inhale daily.      traZODone 50 MG tablet   Commonly known as: DESYREL   Take 50 mg by mouth at bedtime as needed. For sleep           Follow-up Information    Follow up with Stefani Dama, MD. Schedule an appointment as soon as possible for a visit in 3 weeks. (Call Aram Beecham for appointment)    Contact information:   [address] [place] [address] [phone]          Signed: Stefani Dama 07/29/2011, 7:19 PM

## 2011-07-29 NOTE — Progress Notes (Signed)
Occupational Therapy Treatment/ Discharge Note Patient Details Name: [name] MRN: [id number] DOB: 04-Jul-1955 Today's Date: 07/29/2011  OT Assessment/Plan OT Assessment/Plan Comments on Treatment Session: Pt is able to demonstrate toilet and shower transfer at supervision level for safey with min verbal cueing for maintaining back precautions.  All education completed and goals met. OT signing off. OT Plan: Discharge plan remains appropriate OT Frequency: Min 2X/week Follow Up Recommendations: No OT follow up Equipment Recommended: None recommended by PT OT Goals ADL Goals Pt Will Transfer to Toilet: with supervision;with DME;3-in-1;Maintaining back safety precautions ADL Goal: Toilet Transfer - Progress: Met Pt Will Perform Tub/Shower Transfer: Shower transfer;Ambulation;with DME;Maintaining back safety precautions ADL Goal: Tub/Shower Transfer - Progress: Met  OT Treatment Precautions/Restrictions  Precautions Precautions: Back Precaution Comments: Verbally reviewed 3/3 back precautions with patient. Required Braces or Orthoses: No Restrictions Weight Bearing Restrictions: No   ADL ADL Grooming: Performed;Wash/dry hands;Supervision/safety Statistician: Performed;Modified independent Toilet Transfer Method: Proofreader: Raised toilet seat with arms (or 3-in-1 over toilet) Toileting - Clothing Manipulation: Performed;Supervision/safety Toileting - Clothing Manipulation Details (indicate cue type and reason): supervision for maintaining back precautions Where Assessed - Glass blower/designer Manipulation: Standing Tub/Shower Transfer: Engineer, manufacturing Details (indicate cue type and reason): Verbal cueing to avoid twisting Tub/Shower Transfer Method: Science writer: Walk in shower Equipment Used: Rolling walker Ambulation Related to ADLs: Pt required increased time for ambulation to bathroom  due to pain.  Mobility  Bed Mobility Bed Mobility: No Transfers Transfers: Yes Sit to Stand: 5: Supervision;With armrests;With upper extremity assist;From chair/3-in-1;From bed Sit to Stand Details (indicate cue type and reason): Demonstrated correct hand placement independently Stand to Sit: 5: Supervision;To chair/3-in-1;With armrests;With upper extremity assist Stand to Sit Details: Demonstrated controlled descent and safe hand placement independently Exercises    End of Session OT - End of Session Activity Tolerance: Patient limited by pain Patient left: with call bell in reach;in chair Nurse Communication: Mobility status for ambulation General Behavior During Session: [facility] for tasks performed Cognition: [facility] for tasks performed   9:04 AM 07/29/2011 Cipriano Mile OTR/L Pager [phone] Office [phone]

## 2011-07-29 NOTE — Progress Notes (Signed)
Physical Therapy Treatment Patient Details Name: [name] MRN: [id number] DOB: [birth year] Today's Date: 07/29/2011  PT Assessment/Plan  PT - Assessment/Plan Comments on Treatment Session: 56 year old male admitted s/p posterior lumbar fusion. Pt mobilizing very well, mobilizing multiple times with family and nursing throughout day. Able to perform stairs with min assist, reports has available assist at home. From therapy standpoint pt strong enough to go home with HHPT.  PT Plan: Discharge plan remains appropriate PT Frequency: Min 5X/week Follow Up Recommendations: Home health PT;Supervision for mobility/OOB  Equipment Recommended: None recommended by PT PT Goals  Acute Rehab PT Goals Pt will Roll Supine to Right Side: with modified independence PT Goal: Rolling Supine to Right Side - Progress: Progressing toward goal Pt will go Supine/Side to Sit: with modified independence PT Goal: Supine/Side to Sit - Progress: Progressing toward goal Pt will go Sit to Supine/Side: with modified independence PT Goal: Sit to Supine/Side - Progress: Progressing toward goal Pt will go Sit to Stand: with modified independence PT Goal: Sit to Stand - Progress: Progressing toward goal Pt will go Stand to Sit: with modified independence PT Goal: Stand to Sit - Progress: Progressing toward goal Pt will Ambulate: >150 feet;with supervision;with rolling walker PT Goal: Ambulate - Progress: Met Pt will Go Up / Down Stairs: 3-5 stairs;with supervision;with rolling walker (no rails) PT Goal: Up/Down Stairs - Progress: Progressing toward goal Additional Goals Additional Goal #1: Verbalize 3/3 back precautions and demonstrate appropriate use during treatment session with </= 1 verbal cues PT Goal: Additional Goal #1 - Progress: Progressing toward goal  PT Treatment Precautions/Restrictions  Precautions Precautions: Back Precaution Booklet Issued: Yes (comment) (handout given) Precaution Comments:  Verbally reviewed 3/3 back precautions with patient. Required Braces or Orthoses: No (pt donning brace for comfort with 1 verbal cue) Restrictions Weight Bearing Restrictions: No Mobility (including Balance) Bed Mobility Bed Mobility: Yes Rolling Right: 6: Modified independent (Device/Increase time) Right Sidelying to Sit: 6: Modified independent (Device/Increase time) Transfers Transfers: Yes Sit to Stand: 5: Supervision;With upper extremity assist;From bed Sit to Stand Details (indicate cue type and reason): Supervision for safety, no physical assist needed Stand to Sit: 5: Supervision;To chair/3-in-1;With armrests;With upper extremity assist Stand to Sit Details: Demonstrated controlled descent and safe hand placement independently  Ambulation/Gait Ambulation/Gait: Yes Ambulation/Gait Assistance: 5: Supervision Ambulation/Gait Assistance Details (indicate cue type and reason): Improved speed, no overt losses of balance. Practiced with out RW for 20', pt with decreased gait speed and increased imbalance. Continue to recommend RW  Ambulation Distance (Feet): 175 Feet Assistive device: Rolling walker Gait Pattern: Step-through pattern;Decreased stride length;Trunk flexed (Decreased foot clearance) Stairs: Yes Stairs Assistance: 4: Min assist Stairs Assistance Details (indicate cue type and reason): Min assist through 1 hand hold assist. Pt reporting son will be more than able to provide assist. Pt acknowledges he will need assist every time he attempts stairs. Verbal cues for safe sequencing. Stair Management Technique: No rails;Step to pattern;Forwards Number of Stairs: 4  Height of Stairs: 6     End of Session PT - End of Session Equipment Utilized During Treatment: Gait belt Activity Tolerance: Patient tolerated treatment well;Patient limited by fatigue Patient left: with call bell in reach;with family/visitor present;in bed (Encouraged to sit in chair later for supper) Nurse  Communication: Mobility status for ambulation General Behavior During Session: [facility] for tasks performed Cognition: [facility] for tasks performed  Wilhemina Bonito 07/29/2011, 4:27 PM  Sherie Don) [name] PT, DPT Acute Rehabilitation [phone]

## 2011-07-29 NOTE — Progress Notes (Signed)
Utilization review completed. Anette Guarneri, RN, BSN. 08/08/11

## 2011-07-29 NOTE — Progress Notes (Signed)
Pt discharged home via wheelchair with son. No problems or complaints at this time. Discharge instructions given pt verbalized understanding. Estanislado Spire 07/29/2011 7:56 PM

## 2011-08-03 MED FILL — Heparin Sodium (Porcine) Inj 1000 Unit/ML: INTRAMUSCULAR | Qty: 30 | Status: AC

## 2011-08-03 MED FILL — Sodium Chloride IV Soln 0.9%: INTRAVENOUS | Qty: 1000 | Status: AC

## 2011-08-03 MED FILL — Sodium Chloride Irrigation Soln 0.9%: Qty: 3000 | Status: AC

## 2013-05-25 ENCOUNTER — Other Ambulatory Visit: Payer: Self-pay | Admitting: Neurological Surgery

## 2013-05-25 ENCOUNTER — Other Ambulatory Visit (HOSPITAL_COMMUNITY): Payer: Self-pay | Admitting: Neurological Surgery

## 2013-05-25 DIAGNOSIS — M545 Low back pain, unspecified: Secondary | ICD-10-CM

## 2013-05-25 DIAGNOSIS — M5417 Radiculopathy, lumbosacral region: Secondary | ICD-10-CM | POA: Insufficient documentation

## 2013-05-25 DIAGNOSIS — G959 Disease of spinal cord, unspecified: Secondary | ICD-10-CM

## 2013-05-26 ENCOUNTER — Encounter (HOSPITAL_COMMUNITY): Payer: Self-pay | Admitting: Pharmacy Technician

## 2013-06-02 ENCOUNTER — Ambulatory Visit (HOSPITAL_COMMUNITY)
Admission: RE | Admit: 2013-06-02 | Discharge: 2013-06-02 | Disposition: A | Discharge status: Home / Self Care | Payer: Medicare Other | Source: Ambulatory Visit | Attending: Neurological Surgery | Admitting: Neurological Surgery

## 2013-06-02 DIAGNOSIS — M545 Low back pain, unspecified: Secondary | ICD-10-CM

## 2013-06-02 DIAGNOSIS — G959 Disease of spinal cord, unspecified: Secondary | ICD-10-CM

## 2013-06-02 DIAGNOSIS — M47812 Spondylosis without myelopathy or radiculopathy, cervical region: Secondary | ICD-10-CM | POA: Insufficient documentation

## 2013-06-02 DIAGNOSIS — M47817 Spondylosis without myelopathy or radiculopathy, lumbosacral region: Secondary | ICD-10-CM | POA: Insufficient documentation

## 2013-06-02 DIAGNOSIS — Z981 Arthrodesis status: Secondary | ICD-10-CM | POA: Insufficient documentation

## 2013-06-02 MED ORDER — DEXAMETHASONE 4 MG PO TABS
4.0000 mg | ORAL_TABLET | Freq: Once | ORAL | Status: DC
  Filled 2013-06-02: qty 1

## 2013-06-02 MED ORDER — ONDANSETRON HCL 4 MG/2ML IJ SOLN: 4.0000 mg | Freq: Four times a day (QID) | INTRAMUSCULAR | Status: DC | PRN

## 2013-06-02 MED ORDER — IOHEXOL 300 MG/ML  SOLN
10.0000 mL | Freq: Once | INTRAMUSCULAR | Status: AC | PRN
  Administered 2013-06-02: 10 mL via INTRATHECAL

## 2013-06-02 MED ORDER — DIAZEPAM 5 MG PO TABS
10.0000 mg | ORAL_TABLET | Freq: Once | ORAL | Status: AC
  Administered 2013-06-02: 10 mg via ORAL
  Filled 2013-06-02: qty 2

## 2013-06-02 MED ORDER — DIAZEPAM 5 MG PO TABS
ORAL_TABLET | ORAL | Status: AC
  Administered 2013-06-02: 10 mg via ORAL
  Filled 2013-06-02: qty 2

## 2013-06-02 MED ORDER — HYDROCODONE-ACETAMINOPHEN 5-325 MG PO TABS
1.0000 | ORAL_TABLET | ORAL | Status: DC | PRN
  Administered 2013-06-02: 2 via ORAL
  Filled 2013-06-02: qty 2

## 2013-06-02 MED ORDER — HYDROCODONE-ACETAMINOPHEN 5-325 MG PO TABS
ORAL_TABLET | ORAL | Status: AC
  Filled 2013-06-02: qty 2

## 2013-06-02 NOTE — Discharge Instructions (Signed)
Myelography °Care After °These instructions give you information on caring for yourself after your procedure. Your doctor may also give you specific instructions. Call your doctor if you have any problems or questions after your procedure. °HOME CARE °· Rest often the first day. °· When you rest, lie flat, with your head slightly raised (elevated). °· Avoid heavy lifting and activity for 48 hours. °· You may take the bandage (dressing) off 1 day after the test. °GET HELP RIGHT AWAY IF:  °· You have a very bad headache. °· You have a fever. °MAKE SURE YOU: °· Understand these instructions. °· Will watch your condition. °· Will get help right away if you are not doing well or get worse. °Document Released: 01/21/2008 Document Revised: 03/30/2012 Document Reviewed: 01/06/2012 °[employer]® Patient Information ©2014 [employer]. ° °

## 2013-06-02 NOTE — Procedures (Addendum)
[name] is a 58 year old individual who's had extensive cervical and lumbar spondylosis. Is undergone multilevel fusions in the cervical spine both anteriorly and posteriorly in addition to anterior lumbar interbody arthrodesis at L3-4 in addition to decompression and fusion via ray cage technique at L4-5 and L5-S1 he has posterior instrumentation and supplemental pseudoarthrosis at L3-L4 area is had increasing back pain and bilateral leg pain and repeat myelogram is now being completed to look at both difficulties with the cervical spine and also difficulties in the lumbar spine if any abnormalities noted in the thoracic spine this area will also be imaged as he has interscapular pain and centralized thoracic back pain.  Pre op Dx: Cervical and lumbar spondylosis and stenosis Post op Dx: Cervical and lumbar spondylosis and stenosis Procedure: Total myelogram Surgeon: Danielle Dess Puncture level: L2-L3 Fluid color: Clear colorless Injection: 9-1/2 cc of iohexol 300 Findings: Diffuse spondylosis in lumbar spine. For further imaging with CT scanning in the cervical and lumbar spines.

## 2017-02-11 DIAGNOSIS — M4712 Other spondylosis with myelopathy, cervical region: Secondary | ICD-10-CM | POA: Insufficient documentation

## 2017-02-11 DIAGNOSIS — M5000 Cervical disc disorder with myelopathy, unspecified cervical region: Secondary | ICD-10-CM | POA: Insufficient documentation

## 2017-03-31 DIAGNOSIS — M5481 Occipital neuralgia: Secondary | ICD-10-CM | POA: Insufficient documentation

## 2018-01-31 ENCOUNTER — Encounter: Payer: Self-pay | Admitting: Gastroenterology

## 2018-06-14 ENCOUNTER — Ambulatory Visit: Payer: Medicare Other | Admitting: Physician Assistant

## 2018-06-21 ENCOUNTER — Encounter: Payer: Self-pay | Admitting: Gastroenterology

## 2018-06-22 ENCOUNTER — Ambulatory Visit: Payer: Medicare Other | Admitting: Gastroenterology

## 2018-07-07 ENCOUNTER — Ambulatory Visit: Payer: Medicare Other | Admitting: Gastroenterology

## 2018-08-01 ENCOUNTER — Telehealth (INDEPENDENT_AMBULATORY_CARE_PROVIDER_SITE_OTHER): Payer: Medicare Other | Admitting: Gastroenterology

## 2018-08-01 ENCOUNTER — Other Ambulatory Visit: Payer: Self-pay

## 2018-08-01 ENCOUNTER — Encounter: Payer: Self-pay | Admitting: Gastroenterology

## 2018-08-01 VITALS — Ht 67.0 in | Wt 280.0 lb

## 2018-08-01 DIAGNOSIS — R1310 Dysphagia, unspecified: Secondary | ICD-10-CM | POA: Diagnosis not present

## 2018-08-01 DIAGNOSIS — R1319 Other dysphagia: Secondary | ICD-10-CM

## 2018-08-01 DIAGNOSIS — J449 Chronic obstructive pulmonary disease, unspecified: Secondary | ICD-10-CM

## 2018-08-01 MED ORDER — OMEPRAZOLE 40 MG PO CPDR: 40.0000 mg | DELAYED_RELEASE_CAPSULE | Freq: Every day | ORAL | 3 refills | Status: DC

## 2018-08-01 NOTE — Patient Instructions (Signed)
Plan: - Omeprazole 40mg  po qd. - Ba swallow with lat films as well (r/o Zenker's) - EGD at [facility]. - I have instructed patient that he needs to chew foods especially meats and breads well and eat slowly.

## 2018-08-01 NOTE — Progress Notes (Signed)
Chief Complaint:   Referring Provider:  No ref. provider found      ASSESSMENT AND PLAN;   #1. Esophageal Dysphagia. D/d includes esophageal stricture, Schatzki's ring, motility disorder, eosinophilic esophagitis, pill induced esophagitis, rule out esophageal carcinoma or extrinsic lesions.  #2. COPD on home O2 with continued smoking.  Plan: - Omeprazole 40mg  po qd. - Ba swallow with lat films as well (r/o Zenker's) - EGD with dil at [facility] thereafter. Pt would like to wait until COVID-19 threat is over. - I have instructed patient that he needs to chew foods especially meats and breads well and eat slowly. -I have instructed patient to stop smoking.  Have discussed risks associated with smoking including risks of various cancers.     HPI:    [name] is a 63 y.o. male  Dysphagia x 6 months Solids and liquids It gets hung up in the mid chest. Occasional regurgitation Had 3 neck surgeries 03,05,06 Has been smoking Has COPD on home O2 No recent weight loss Denies having any melena or hematochezia. No fever or chills. Has a history of chronic constipation ever since he has been on narcotics.  Better with milk of magnesia.  Wants to hold off on colon.  He does understand the risks and benefits   Past GI procedures: -Colonoscopy 05/2010-colonic polyp status post polypectomy, moderate sigmoid diverticulosis. Bx- TA.  Told to get it repeated in 3 years.  Refusing currently - Past Medical History:  Diagnosis Date  . Anxiety    went off medicines for anxiety 6 months ago ( 12/2010), pt. "stays nervous", per pt.   . Arthritis    lumbar stenosis   . Benign prostatic hyperplasia with lower urinary tract symptoms   . Chronic pain syndrome   . Chronic pansinusitis   . Chronic pharyngitis   . Contusion of thorax, unspecified, initial encounter   . COPD (chronic obstructive pulmonary disease) (HCC)   . Cysts    groin area (on & off- for 4 +yrs.) for which he uses  Doxycyline on going, BID, Rx fr. Dr. Fatima Blank in [place], [place].,    . Dependence on supplemental oxygen   . Diabetes mellitus (HCC)   . Drug induced constipation   . Dysphagia   . Finger infection    05/2011, L hand, index finger  - I&D in MD office, treated /w antibiotic.Marland KitchenMarland KitchenMarland Kitchen?name  . Headache(784.0)    uses excedrin prn  . History of colon polyps   . History of normal resting EKG    2009, preop  . Hypertension   . Long term (current) use of opiate analgesic   . Myocardial infarction (HCC)   . Neuromuscular disorder (HCC)    pseudoarthrosis   . Normal cardiac stress test    2005, done prior to back surgery  . Other chronic nonsuppurative otitis media, bilateral   . Shortness of breath    pt. reports related to pain   . Sleep apnea    study- 2012[facility] , Dr. Blenda Nicely, [address]  , uses CPAP  qo night     Past Surgical History:  Procedure Laterality Date  . APPENDECTOMY     as a teen   . BACK SURGERY     multiple, lumbar & cerv. fusions, last low back- 2009  . COLONOSCOPY  06/06/2010   Colon polyps- status post polypectomy. Moderate predominantly sigmoid diverticulosis. Internal hemorrhoids.  . ESOPHAGOGASTRODUODENOSCOPY  06/14/2008   Mild gastritis. Otherwise normal EGD.   Marland Kitchen  HEMORROIDECTOMY     2012[facility]  . NECK SURGERY      Family History  Problem Relation Age of Onset  . Alcoholism Mother   . Alcoholism Father   . Coronary artery disease Father   . Hypertension Father   . Anesthesia problems Neg Hx     Social History   Tobacco Use  . Smoking status: Current Every Day Smoker    Packs/day: 1.00  . Smokeless tobacco: Never Used  Substance Use Topics  . Alcohol use: Not on file  . Drug use: No    Current Outpatient Medications  Medication Sig Dispense Refill  . busPIRone (BUSPAR) 30 MG tablet Take 30 mg by mouth 3 (three) times daily.    . Cyanocobalamin (VITAMIN B-12) 5000 MCG LOZG Take 5,000 mg by mouth daily.     Marland Kitchen  doxazosin (CARDURA) 8 MG tablet Take 8 mg by mouth daily.    Marland Kitchen FLUoxetine (PROZAC) 40 MG capsule Take 40 mg by mouth daily.    Marland Kitchen gabapentin (NEURONTIN) 300 MG capsule Take 300 mg by mouth 3 (three) times daily.    . metoprolol tartrate (LOPRESSOR) 50 MG tablet Take 50 mg by mouth daily.    Marland Kitchen morphine (KADIAN) 60 MG 24 hr capsule Take 60 mg by mouth 2 (two) times daily.    . mupirocin cream (BACTROBAN) 2 % Apply 1 application topically 2 (two) times daily as needed (for infection).     Marland Kitchen oxycodone (OXYCONTIN) 30 MG TB12 Take 30 mg by mouth 4 (four) times daily.     . Pseudoeph-Doxylamine-DM-APAP (NYQUIL PO) Take 15 mLs by mouth at bedtime as needed (for cold).     . Tamsulosin HCl (FLOMAX) 0.4 MG CAPS Take 0.4 mg by mouth daily.     Marland Kitchen tiZANidine (ZANAFLEX) 4 MG capsule Take 4 mg by mouth 3 (three) times daily.    . traZODone (DESYREL) 50 MG tablet Take 50 mg by mouth at bedtime. For sleep     No current facility-administered medications for this visit.     Allergies  Allergen Reactions  . Strawberry Extract Rash    Review of Systems:  Constitutional: Denies fever, chills, diaphoresis, appetite change and fatigue.  HEENT: Denies photophobia, eye pain, redness, hearing loss, ear pain, congestion, sore throat, rhinorrhea, sneezing, mouth sores, neck pain, neck stiffness and tinnitus.   Respiratory: Has SOB, DOE, cough, chest tightness,  and occ wheezing.   Cardiovascular: Denies chest pain, palpitations and leg swelling.  Genitourinary: Denies dysuria, urgency, frequency, hematuria, flank pain and difficulty urinating.  Musculoskeletal: Has myalgias, back pain, joint swelling, arthralgias and gait problem.  Skin: No rash.  Neurological: Denies dizziness, seizures, syncope, weakness, light-headedness, has numbness and headaches.  Hematological: Denies adenopathy. Easy bruising, personal or family bleeding history  Psychiatric/Behavioral: No anxiety or depression     Physical Exam:     Ht 5\' 7"  (1.702 m)   Wt 280 lb (127 kg)   BMI 43.85 kg/m  Filed Weights   08/01/18 1442  Weight: 280 lb (127 kg)   Not examined since it was a tele-visit.  Data Reviewed: I have personally reviewed following labs and imaging studies  CBC: CBC Latest Ref Rng & Units 07/28/2011 07/21/2011 01/31/2008  WBC 4.0 - 10.5 K/uL 8.6 9.6 8.8  Hemoglobin 13.0 - 17.0 g/dL 60.6 17.3(H) 15.6  Hematocrit 39.0 - 52.0 % 43.2 48.9 46.1  Platelets 150 - 400 K/uL 148(L) 170 220    CMP: CMP Latest Ref Rng &  Units 07/28/2011 07/21/2011 02/06/2008  Glucose 70 - 99 mg/dL 606(T) 016(W) 109(N)  BUN 6 - 23 mg/dL 8 8 10   Creatinine 0.50 - 1.35 mg/dL 2.35 5.73 2.20  Sodium 135 - 145 mEq/L 135 137 140  Potassium 3.5 - 5.1 mEq/L 4.3 5.2(H) 5.2(H)  Chloride 96 - 112 mEq/L 102 103 105  CO2 19 - 32 mEq/L 24 25 28   Calcium 8.4 - 10.5 mg/dL 8.5 9.7 9.4  This service was provided via telemedicine.  The patient was located at home.  The provider was located in office.  The patient did consent to this telephone visit and is aware of possible charges through their insurance for this visit.    Time spent on call and coordination of care: 45 min     Edman Circle, MD 08/01/2018, 3:14 PM  Cc: No ref. provider found

## 2018-08-02 ENCOUNTER — Telehealth: Payer: Self-pay | Admitting: *Deleted

## 2018-08-02 ENCOUNTER — Other Ambulatory Visit: Payer: Self-pay | Admitting: *Deleted

## 2018-08-02 DIAGNOSIS — R1310 Dysphagia, unspecified: Secondary | ICD-10-CM

## 2018-08-02 DIAGNOSIS — R1319 Other dysphagia: Secondary | ICD-10-CM

## 2018-08-02 NOTE — Progress Notes (Signed)
You have been scheduled for a Barium Esophogram at [facility]  (1st floor of the hospital) on 09/01/2018 at 9:45am. Please arrive 15 minutes prior to your appointment for registration. Make certain not to have anything to eat or drink 3 hours prior to your test. If you need to reschedule for any reason, please contact radiology at [phone] to do so. __________________________________________________________________ A barium swallow is an examination that concentrates on views of the esophagus. This tends to be a double contrast exam (barium and two liquids which, when combined, create a gas to distend the wall of the oesophagus) or single contrast (non-ionic iodine based). The study is usually tailored to your symptoms so a good history is essential. Attention is paid during the study to the form, structure and configuration of the esophagus, looking for functional disorders (such as aspiration, dysphagia, achalasia, motility and reflux) EXAMINATION You may be asked to change into a gown, depending on the type of swallow being performed. A radiologist and radiographer will perform the procedure. The radiologist will advise you of the type of contrast selected for your procedure and direct you during the exam. You will be asked to stand, sit or lie in several different positions and to hold a small amount of fluid in your mouth before being asked to swallow while the imaging is performed .In some instances you may be asked to swallow barium coated marshmallows to assess the motility of a solid food bolus. The exam can be recorded as a digital or video fluoroscopy procedure. POST PROCEDURE It will take 1-2 days for the barium to pass through your system. To facilitate this, it is important, unless otherwise directed, to increase your fluids for the next 24-48hrs and to resume your normal diet.  This test typically takes about 30 minutes to  perform. __________________________________________________________________________________

## 2018-08-02 NOTE — Telephone Encounter (Signed)
Called patient to inform date and time of Barium Swallow and instructions   [facility] on 09/01/2018 at 10am NPO 3 hours

## 2018-08-12 ENCOUNTER — Telehealth: Payer: Self-pay | Admitting: Gastroenterology

## 2018-09-01 ENCOUNTER — Ambulatory Visit (HOSPITAL_COMMUNITY): Payer: Medicare Other

## 2018-09-27 ENCOUNTER — Telehealth: Payer: Self-pay | Admitting: Gastroenterology

## 2018-09-28 NOTE — Telephone Encounter (Signed)
Called patient and gave him the number for central scheduling but patient said that he will go ahead and proceed with the Barium Swallow

## 2018-09-30 ENCOUNTER — Ambulatory Visit (HOSPITAL_COMMUNITY): Payer: Medicare Other

## 2018-11-25 ENCOUNTER — Ambulatory Visit (HOSPITAL_COMMUNITY): Admission: RE | Admit: 2018-11-25 | Payer: Medicare Other | Source: Ambulatory Visit

## 2019-04-03 ENCOUNTER — Other Ambulatory Visit: Payer: Self-pay | Admitting: Neurological Surgery

## 2019-04-03 DIAGNOSIS — S32010D Wedge compression fracture of first lumbar vertebra, subsequent encounter for fracture with routine healing: Secondary | ICD-10-CM

## 2019-05-01 ENCOUNTER — Ambulatory Visit
Admission: RE | Admit: 2019-05-01 | Discharge: 2019-05-01 | Disposition: A | Discharge status: Home / Self Care | Payer: Medicare Other | Source: Ambulatory Visit | Attending: Neurological Surgery | Admitting: Neurological Surgery

## 2019-05-01 ENCOUNTER — Other Ambulatory Visit: Payer: Self-pay

## 2019-05-01 DIAGNOSIS — S32010D Wedge compression fracture of first lumbar vertebra, subsequent encounter for fracture with routine healing: Secondary | ICD-10-CM

## 2019-05-01 MED ORDER — GADOBENATE DIMEGLUMINE 529 MG/ML IV SOLN
20.0000 mL | Freq: Once | INTRAVENOUS | Status: AC | PRN
  Administered 2019-05-01: 14:00:00 20 mL via INTRAVENOUS

## 2019-09-28 ENCOUNTER — Other Ambulatory Visit: Payer: Self-pay | Admitting: Neurological Surgery

## 2019-09-28 DIAGNOSIS — S22080D Wedge compression fracture of T11-T12 vertebra, subsequent encounter for fracture with routine healing: Secondary | ICD-10-CM

## 2019-10-16 ENCOUNTER — Telehealth: Payer: Self-pay | Admitting: Gastroenterology

## 2019-10-16 NOTE — Telephone Encounter (Signed)
Pt's wife is requesting a call back to schedule EGD w Dil at [facility]. Pt is also due for a colonoscopy.

## 2019-10-17 NOTE — Telephone Encounter (Signed)
Called patient and left a message regarding a back log for procedures at [facility].  I told her we would put Mr Stump on the waiting list and will give him a call when a slot becomes available.

## 2019-10-28 ENCOUNTER — Ambulatory Visit
Admission: RE | Admit: 2019-10-28 | Discharge: 2019-10-28 | Disposition: A | Discharge status: Home / Self Care | Payer: Medicare Other | Source: Ambulatory Visit | Attending: Neurological Surgery | Admitting: Neurological Surgery

## 2019-10-28 ENCOUNTER — Other Ambulatory Visit: Payer: Self-pay

## 2019-10-28 DIAGNOSIS — S22080D Wedge compression fracture of T11-T12 vertebra, subsequent encounter for fracture with routine healing: Secondary | ICD-10-CM

## 2019-11-08 DIAGNOSIS — M8088XD Other osteoporosis with current pathological fracture, vertebra(e), subsequent encounter for fracture with routine healing: Secondary | ICD-10-CM | POA: Insufficient documentation

## 2019-11-14 ENCOUNTER — Other Ambulatory Visit: Payer: Self-pay | Admitting: Neurological Surgery

## 2019-11-22 NOTE — Progress Notes (Signed)
[facility] - [place], [place] - [address] [place] [address] Phone: [phone] Fax: [phone]      Your procedure is scheduled on Monday, August 2nd.  Report to [facility] Main Entrance "A" at 6:30 A.M., and check in at the Admitting office.  Call this number if you have problems the morning of surgery:  [phone]  Call [phone] if you have any questions prior to your surgery date Monday-Friday 8am-4pm    Remember:  Do not eat or drink after midnight the night before your surgery     Take these medicines the morning of surgery with A SIP OF WATER   Albuterol Nebulizer - if needed  Albuterol inhaler - if needed (bring with you on the day of surgery)  Buspar  Duloxetine (Cymbalta)  Flonase nasal spray   Gabapentin (Neurontin)  Metolprolol   Oxycodone - if needed    As of today, STOP taking any Aspirin (unless otherwise instructed by your surgeon) Aleve, Naproxen, Ibuprofen, Motrin, Advil, Goody's, BC's, all herbal medications, fish oil, and all vitamins.    HOW TO MANAGE YOUR DIABETES BEFORE AND AFTER SURGERY  Why is it important to control my blood sugar before and after surgery? . Improving blood sugar levels before and after surgery helps healing and can limit problems. . A way of improving blood sugar control is eating a healthy diet by: o  Eating less sugar and carbohydrates o  Increasing activity/exercise o  Talking with your doctor about reaching your blood sugar goals . High blood sugars (greater than 180 mg/dL) can raise your risk of infections and slow your recovery, so you will need to focus on controlling your diabetes during the weeks before surgery. . Make sure that the doctor who takes care of your diabetes knows about your planned surgery including the date and location.  How do I manage my blood sugar before surgery? . Check your blood sugar at least 4 times a day, starting 2 days before surgery, to make sure  that the level is not too high or low. . Check your blood sugar the morning of your surgery when you wake up and every 2 hours until you get to the Short Stay unit. o If your blood sugar is less than 70 mg/dL, you will need to treat for low blood sugar: - Do not take insulin. - Treat a low blood sugar (less than 70 mg/dL) with  cup of clear juice (cranberry or apple), 4 glucose tablets, OR glucose gel. - Recheck blood sugar in 15 minutes after treatment (to make sure it is greater than 70 mg/dL). If your blood sugar is not greater than 70 mg/dL on recheck, call [phone] for further instructions. . Report your blood sugar to the short stay nurse when you get to Short Stay.  . If you are admitted to the hospital after surgery: o Your blood sugar will be checked by the staff and you will probably be given insulin after surgery (instead of oral diabetes medicines) to make sure you have good blood sugar levels. o The goal for blood sugar control after surgery is 80-180 mg/dL.               Day of Surgery:                   Do not wear jewelry            Do not wear lotions, powders, colognes, or deodorant.  Men may shave face and neck.            Do not bring valuables to the hospital.            [facility] is not responsible for any belongings or valuables.  Do NOT Smoke (Tobacco/Vaping) or drink Alcohol 24 hours prior to your procedure If you use a CPAP at night, you may bring all equipment for your overnight stay.   Contacts, glasses, dentures or bridgework may not be worn into surgery.      For patients admitted to the hospital, discharge time will be determined by your treatment team.   Patients discharged the day of surgery will not be allowed to drive home, and someone needs to stay with them for 24 hours.    Special instructions:   [place]- Preparing For Surgery  Before surgery, you can play an important role. Because skin is not sterile, your skin needs to  be as free of germs as possible. You can reduce the number of germs on your skin by washing with CHG (chlorahexidine gluconate) Soap before surgery.  CHG is an antiseptic cleaner which kills germs and bonds with the skin to continue killing germs even after washing.    Oral Hygiene is also important to reduce your risk of infection.  Remember - BRUSH YOUR TEETH THE MORNING OF SURGERY WITH YOUR REGULAR TOOTHPASTE  Please do not use if you have an allergy to CHG or antibacterial soaps. If your skin becomes reddened/irritated stop using the CHG.  Do not shave (including legs and underarms) for at least 48 hours prior to first CHG shower. It is OK to shave your face.  Please follow these instructions carefully.   1. Shower the NIGHT BEFORE SURGERY and the MORNING OF SURGERY with CHG Soap.   2. If you chose to wash your hair, wash your hair first as usual with your normal shampoo.  3. After you shampoo, rinse your hair and body thoroughly to remove the shampoo.  4. Use CHG as you would any other liquid soap. You can apply CHG directly to the skin and wash gently with a scrungie or a clean washcloth.   5. Apply the CHG Soap to your body ONLY FROM THE NECK DOWN.  Do not use on open wounds or open sores. Avoid contact with your eyes, ears, mouth and genitals (private parts). Wash Face and genitals (private parts)  with your normal soap.   6. Wash thoroughly, paying special attention to the area where your surgery will be performed.  7. Thoroughly rinse your body with warm water from the neck down.  8. DO NOT shower/wash with your normal soap after using and rinsing off the CHG Soap.  9. Pat yourself dry with a CLEAN TOWEL.  10. Wear CLEAN PAJAMAS to bed the night before surgery  11. Place CLEAN SHEETS on your bed the night of your first shower and DO NOT SLEEP WITH PETS.   Day of Surgery: Wear Clean/Comfortable clothing the morning of surgery Do not apply any deodorants/lotions.   Remember  to brush your teeth WITH YOUR REGULAR TOOTHPASTE.   Please read over the following fact sheets that you were given.

## 2019-11-23 ENCOUNTER — Inpatient Hospital Stay (HOSPITAL_COMMUNITY)
Admission: RE | Admit: 2019-11-23 | Discharge: 2019-11-23 | Disposition: A | Discharge status: Home / Self Care | Payer: Medicare Other | Source: Ambulatory Visit

## 2019-11-23 ENCOUNTER — Other Ambulatory Visit (HOSPITAL_COMMUNITY): Payer: Medicare Other

## 2019-11-23 ENCOUNTER — Encounter (HOSPITAL_COMMUNITY): Payer: Self-pay

## 2019-11-23 ENCOUNTER — Encounter (HOSPITAL_COMMUNITY): Payer: Self-pay | Admitting: Neurological Surgery

## 2019-11-23 ENCOUNTER — Other Ambulatory Visit: Payer: Self-pay

## 2019-11-23 HISTORY — DX: Wedge compression fracture of unspecified thoracic vertebra, initial encounter for closed fracture: S22.000A

## 2019-11-23 NOTE — Progress Notes (Signed)
[facility] - Daleen Squibb, [place] - [address] [place] [address] Phone: [phone] Fax: [phone]      Your procedure is scheduled on Monday, August 2nd.  Report to [facility] Main Entrance "A" at 6:30 A.M., and check in at the Admitting office.  Call this number if you have problems the morning of surgery:  [phone]  Call [phone] if you have any questions prior to your surgery date Monday-Friday 8am-4pm    Remember:  Do not eat or drink after midnight the night before your surgery     Take these medicines the morning of surgery with A SIP OF WATER   Buspar  Duloxetine (Cymbalta)  Flonase nasal spray   Gabapentin (Neurontin)  Metolprolol   Tamsulosin HCl (FLOMAX)  Oxycodone - if needed  Albuterol Nebulizer - if needed  Albuterol inhaler - if needed (bring with you on the day of surgery)  As of today, STOP taking any Aspirin (unless otherwise instructed by your surgeon) Aleve, Naproxen, Ibuprofen, Motrin, Advil, Goody's, BC's, all herbal medications, fish oil, and all vitamins.    HOW TO MANAGE YOUR DIABETES BEFORE AND AFTER SURGERY  Why is it important to control my blood sugar before and after surgery? . Improving blood sugar levels before and after surgery helps healing and can limit problems. . A way of improving blood sugar control is eating a healthy diet by: o  Eating less sugar and carbohydrates o  Increasing activity/exercise o  Talking with your doctor about reaching your blood sugar goals . High blood sugars (greater than 180 mg/dL) can raise your risk of infections and slow your recovery, so you will need to focus on controlling your diabetes during the weeks before surgery. . Make sure that the doctor who takes care of your diabetes knows about your planned surgery including the date and location.  How do I manage my blood sugar before surgery? . Check your blood sugar at least 4 times a day, starting 2 days before  surgery, to make sure that the level is not too high or low. . Check your blood sugar the morning of your surgery when you wake up and every 2 hours until you get to the Short Stay unit. o If your blood sugar is less than 70 mg/dL, you will need to treat for low blood sugar: - Do not take insulin. - Treat a low blood sugar (less than 70 mg/dL) with  cup of clear juice (cranberry or apple), 4 glucose tablets, OR glucose gel. - Recheck blood sugar in 15 minutes after treatment (to make sure it is greater than 70 mg/dL). If your blood sugar is not greater than 70 mg/dL on recheck, call [phone] for further instructions. . Report your blood sugar to the short stay nurse when you get to Short Stay.  . If you are admitted to the hospital after surgery: o Your blood sugar will be checked by the staff and you will probably be given insulin after surgery (instead of oral diabetes medicines) to make sure you have good blood sugar levels. o The goal for blood sugar control after surgery is 80-180 mg/dL.               Day of Surgery:                   Do not wear jewelry            Do not wear lotions, powders, colognes, or  deodorant.            Men may shave face and neck.            Do not bring valuables to the hospital.            [facility] is not responsible for any belongings or valuables.  Do NOT Smoke (Tobacco/Vaping) or drink Alcohol 24 hours prior to your procedure If you use a CPAP at night, you may bring all equipment for your overnight stay.   Contacts, glasses, dentures or bridgework may not be worn into surgery.      For patients admitted to the hospital, discharge time will be determined by your treatment team.   Patients discharged the day of surgery will not be allowed to drive home, and someone needs to stay with them for 24 hours.    Special instructions:   [facility]- Preparing For Surgery  Before surgery, you can play an important role. Because skin is not  sterile, your skin needs to be as free of germs as possible. You can reduce the number of germs on your skin by washing with CHG (chlorahexidine gluconate) Soap before surgery.  CHG is an antiseptic cleaner which kills germs and bonds with the skin to continue killing germs even after washing.    Oral Hygiene is also important to reduce your risk of infection.  Remember - BRUSH YOUR TEETH THE MORNING OF SURGERY WITH YOUR REGULAR TOOTHPASTE  Please do not use if you have an allergy to CHG or antibacterial soaps. If your skin becomes reddened/irritated stop using the CHG.  Do not shave (including legs and underarms) for at least 48 hours prior to first CHG shower. It is OK to shave your face.  Please follow these instructions carefully.   1. Shower the NIGHT BEFORE SURGERY and the MORNING OF SURGERY with CHG Soap.   2. If you chose to wash your hair, wash your hair first as usual with your normal shampoo.  3. After you shampoo, rinse your hair and body thoroughly to remove the shampoo.  4. Use CHG as you would any other liquid soap. You can apply CHG directly to the skin and wash gently with a scrungie or a clean washcloth.   5. Apply the CHG Soap to your body ONLY FROM THE NECK DOWN.  Do not use on open wounds or open sores. Avoid contact with your eyes, ears, mouth and genitals (private parts). Wash Face and genitals (private parts)  with your normal soap.   6. Wash thoroughly, paying special attention to the area where your surgery will be performed.  7. Thoroughly rinse your body with warm water from the neck down.  8. DO NOT shower/wash with your normal soap after using and rinsing off the CHG Soap.  9. Pat yourself dry with a CLEAN TOWEL.  10. Wear CLEAN PAJAMAS to bed the night before surgery  11. Place CLEAN SHEETS on your bed the night of your first shower and DO NOT SLEEP WITH PETS.   Day of Surgery: Wear Clean/Comfortable clothing the morning of surgery Do not apply any  deodorants/lotions.   Remember to brush your teeth WITH YOUR REGULAR TOOTHPASTE.   Please read over the following fact sheets that you were given.

## 2019-11-23 NOTE — Progress Notes (Signed)
Pt stated that he called the surgeon's office and made Shanda Bumps, Surgical Coordinator, aware that he was cancelling his PAT appointment due to pain issues. Pt stated that Shanda Bumps advised that labs can be done on DOS. Nurse made pt a SDW-pre-op call. Pt denies any acute pulmonary issues. Pt wears supplemental Oxygen. Pt denies having a cardiac cath. Pt denies having an EKG and chest x ray in the last year. Pt denies recent labs. Pt denies history of diabetes. Pt made aware to stop taking  Aspirin (unless otherwise advised by surgeon), vitamins, fish oil and herbal medications. Do not take any NSAIDs ie: Ibuprofen, Advil, Naproxen (Aleve), Motrin, BC and Goody Powder. Pt stated that he had diagnostic studies at the following places:  Nurse requested LOV note, EKG tracing, stress test and all other cardiac studies from [facility], Dr. Wille Glaser. Nurse requested Sleep Study form [facility]. Nurse requested D/C summary, EKG tracing and echo from [facility]. Nurse awaiting response for all requests. COVID TEST DOS. Pt verbalized understanding of all pre-op instructions. Pt reminded to quarantine. Pt verbalized understanding of all pre-op instructions. PA, Anesthesiology, asked to review pt history.

## 2019-11-23 NOTE — Progress Notes (Addendum)
Anesthesia Chart Review: SAME DAY WORK-UP (patient cancelled 11/23/19 PAT visit due to pain)  Case: [id number] Date/Time: 11/27/19 0815   Procedure: T11 - T12 - L1 KYPHOPLASTY (N/A ) - 3C   Anesthesia type: General   Pre-op diagnosis: Compression fractures   Location: [facility] OR ROOM 19 / [facility] OR   Surgeons: Barnett Abu, MD      DISCUSSION: Patient is a 64 year old male scheduled for the above procedure.  History includes smoking, COPD with home oxygen use (2L at rest and at night, 3L with activity; on O2 for ~ 10 years), OSA (non-compliant with CPAP), HTN, anxiety, dyspnea, BPH, GERD, chronic pain syndrome, headaches, back and neck surgeries (4 back surgeries and 3 neck surgeries since the mid-90's), pseudarthrosis, pre-diabetes (A1c 6.2% 6//4/21). Mildly decreased LVF on 2016 and 2018 studies (see below), although patient denied MI history or LHC/PCI.    He was evaluated by cardiologist Bonnielee Haff, MD on 02/05/15 for atypical chest pain. Dobutamine perfusion stress test at [facility] ordered with as needed follow-up unless test abnormal. Stress test was non-ischemic.   He thought he had a sleep study through [facility], but no records for this patient available.   Records from [facility] received which did not include a recent EKG. Left visit (telemedicine) with Treasa School, PA-C was on October 26, 2019 for follow-up chronic pain.  I called and spoke with Mr. Dowe.  He reports being on home oxygen for about 10 years.  He uses 2 L at rest and at nighttime, and 3 L at night.  He is intolerant to CPAP. He denies known MI history, although reports he was told his left ventricle function was mildly decreased in the past.  No history of cardiac cath or PCI.  He is then disabled due to chronic back issues.  He has had a total of 7 neck and/or back surgeries.  He walks with a cane.  He is unable to walk around the store.  He recently fell and sustained  compression fractures and is now requiring use of a walker.  He is able to lay flat without breathing difficulties.  He has noticed some generalized leg swelling since he cannot be as active and its uncomfortable for him to elevate his legs.  He is on Lasix.  Overall he feels his breathing status is around his baseline.  He uses albuterol twice daily.  In general no dyspnea at rest although this can happen particularly in hot humid weather.  He smokes 1 pack of cigarettes per day.  He denied chest pain, palpitations, syncope.  He denied any known anesthesia complications.  He is unaware of any renal or liver issues.  He has not had any recent labs or EKG.  He is on MS Contin 60 mg every 12 hours and oxycodone 15 mg immediate release tablets as needed.  COPD and pain management is managed through his PCP.  He does not see a pulmonologist or cardiologist.  Discussed above with anesthesiologist Kipp Brood, MD. Patient denied chest pain. He is limited in activity due to his back and respiratory issues. Back pain is his most limiting factor currently. He feels that overall his breathing is stable--on 2-3L and ongoing smoking. He is a same day work-up, so he will get labs and EKG on the day of surgery. Definitive anesthesia plan following evaluation by anesthesia team.   He is for COVID-19 test on the day of surgery.    VS: For  day of surgery.   PROVIDERS: Treasa School, PA-C is PCP ([facility])   LABS: For day of procedure. Comparison labs from 09/29/19 are on chart--A1c 6.2% ([facility]).    IMAGES: MRI T-spine 10/28/19: IMPRESSION: 1. Acute to subacute compression fractures of T11, T12, and L1 vertebral bodies as above. These are benign/mechanical in nature. 2. Multilevel degenerative spondylosis and facet hypertrophy throughout the thoracic spine as above. Resultant mild to moderate spinal stenosis at T10-11 and T11-12. Multilevel foraminal narrowing within  the lower thoracic spine as above.  Xray T-spine 09/05/19 [facility]): IMPRESSION: 1. Compression fracture involving the upper endplate of T12 on the order of 20% or so, age indeterminate but possibly subacute. 2. Osseous demineralization. 3. Mild mid and lower thoracic spondylosis.  Xray L-spine 09/05/19 [facility]): IMPRESSION: - Interval development of L1 compression fracture with approximately 50% height loss and no retropulsion of the posterior endplate. - Surgical changes of the L3-S1 level as above.   EKG: Requested for [facility] and PCP, if available, otherwise plan day of surgery.  Last EKG received from [facility] showed normal sinus rhythm on 01/20/2016, so would need updated tracing.   CV: Echo 08/13/16 [facility]): Conclusions: 1.  A technically difficult study.  Definity not used due to emphysema. 2.  The left ventricle is mildly dilated. 3.  There is mild concentric left ventricular hypertrophy. 4.  Overall left ventricular systolic function is low normal with an EF between 50 to 55%. 5.  The diastolic filling pattern indicates impaired relaxation. 6.  Trace tricuspid regurgitation present.  Unable to estimate RVSP due to inadequate TR jet spectral Doppler profile.   Dobutamine stress test 02/14/15 [facility]): Impression: 1.  Functional capacity is not assessed. 2.  Mildly dilated left ventricle with mildly depressed LVEF of 48%. 3.  Fixed posterior defect, likely diaphragm attenuation artifact. 4.  No clinical or scintigraphic ischemia at target heart rate on dobutamine. -Space negative dobutamine perfusion stress test for ischemia.   Past Medical History:  Diagnosis Date   Anxiety    went off medicines for anxiety 6 months ago ( 12/2010), pt. "stays nervous", per pt.    Arthritis    lumbar stenosis    Benign prostatic hyperplasia with lower urinary tract symptoms    Chronic pain syndrome    Chronic pansinusitis     Chronic pharyngitis    Compression fracture of thoracic vertebra (HCC)    T11,T12 and L1   Contusion of thorax, unspecified, initial encounter    COPD (chronic obstructive pulmonary disease) (HCC)    Cysts    groin area (on & off- for 4 +yrs.) for which he uses Doxycyline on going, BID, Rx fr. Dr. Fatima Blank in [place], [place].,     Dependence on supplemental oxygen    Depression    Diabetes mellitus [facility])    Drug induced constipation    Dysphagia    Finger infection    05/2011, L hand, index finger  - I&D in MD office, treated /w antibiotic.Marland KitchenMarland KitchenMarland Kitchen?name   GERD (gastroesophageal reflux disease)    Headache(784.0)    uses excedrin prn   History of colon polyps    History of normal resting EKG    2009, preop   Hypertension    Long term (current) use of opiate analgesic    Myocardial infarction [facility])    Neuromuscular disorder (HCC)    pseudoarthrosis    Normal cardiac stress test    2005, done prior to back surgery  Other chronic nonsuppurative otitis media, bilateral    Shortness of breath    pt. reports related to pain    Sleep apnea    study- [facility] , Dr. Blenda Nicely, [address]  , uses CPAP  qo night    Wears glasses     Past Surgical History:  Procedure Laterality Date   APPENDECTOMY     as a teen    BACK SURGERY     multiple, lumbar & cerv. fusions, last low back- 2009   COLONOSCOPY  06/06/2010   Colon polyps- status post polypectomy. Moderate predominantly sigmoid diverticulosis. Internal hemorrhoids.   ESOPHAGOGASTRODUODENOSCOPY  06/14/2008   Mild gastritis. Otherwise normal EGD.    HEMORROIDECTOMY     2012[facility]   NECK SURGERY      MEDICATIONS:  albuterol (PROVENTIL) (2.5 MG/3ML) 0.083% nebulizer solution   albuterol (VENTOLIN HFA) 108 (90 Base) MCG/ACT inhaler   Aspirin-Acetaminophen-Caffeine (GOODYS EXTRA STRENGTH) 500-325-65 MG PACK   busPIRone (BUSPAR) 30 MG tablet   cholecalciferol (VITAMIN D3)  25 MCG (1000 UNIT) tablet   DULoxetine (CYMBALTA) 60 MG capsule   fluticasone (FLONASE) 50 MCG/ACT nasal spray   furosemide (LASIX) 20 MG tablet   gabapentin (NEURONTIN) 300 MG capsule   metoprolol succinate (TOPROL-XL) 50 MG 24 hr tablet   morphine (MS CONTIN) 60 MG 12 hr tablet   oxyCODONE (ROXICODONE) 15 MG immediate release tablet   Tamsulosin HCl (FLOMAX) 0.4 MG CAPS   tiZANidine (ZANAFLEX) 4 MG capsule   traZODone (DESYREL) 50 MG tablet   No current facility-administered medications for this encounter.    Shonna Chock, PA-C Surgical Short Stay/Anesthesiology [facility] Phone [phone] [facility] Phone [phone] 11/24/2019 4:41 PM

## 2019-11-24 ENCOUNTER — Encounter (HOSPITAL_COMMUNITY): Payer: Self-pay

## 2019-11-24 MED ORDER — DEXTROSE 5 % IV SOLN
3.0000 g | INTRAVENOUS | Status: AC
  Administered 2019-11-27: 3 g via INTRAVENOUS
  Filled 2019-11-24: qty 3

## 2019-11-24 NOTE — Anesthesia Preprocedure Evaluation (Addendum)
Anesthesia Evaluation  Patient identified by MRN, date of birth, ID band Patient awake    Reviewed: Allergy & Precautions, NPO status , Patient's Chart, lab work & pertinent test results  Airway Mallampati: III  TM Distance: >3 FB Neck ROM: Full    Dental  (+) Dental Advisory Given   Pulmonary shortness of breath, sleep apnea , COPD, Current Smoker and Patient abstained from smoking.,    breath sounds clear to auscultation       Cardiovascular hypertension, Pt. on medications + Past MI   Rhythm:Regular Rate:Normal     Neuro/Psych  Headaches,  Neuromuscular disease    GI/Hepatic Neg liver ROS, GERD  ,  Endo/Other  diabetesMorbid obesity  Renal/GU negative Renal ROS     Musculoskeletal  (+) Arthritis ,   Abdominal   Peds  Hematology negative hematology ROS (+)   Anesthesia Other Findings   Reproductive/Obstetrics                            Anesthesia Physical Anesthesia Plan  ASA: III  Anesthesia Plan: General   Post-op Pain Management:    Induction: Intravenous  PONV Risk Score and Plan: 1 and Dexamethasone, Ondansetron and Treatment may vary due to age or medical condition  Airway Management Planned: Oral ETT  Additional Equipment: None  Intra-op Plan:   Post-operative Plan: Extubation in OR  Informed Consent: I have reviewed the patients History and Physical, chart, labs and discussed the procedure including the risks, benefits and alternatives for the proposed anesthesia with the patient or authorized representative who has indicated his/her understanding and acceptance.     Dental advisory given  Plan Discussed with: CRNA  Anesthesia Plan Comments: (   )       Anesthesia Quick Evaluation

## 2019-11-27 ENCOUNTER — Ambulatory Visit (HOSPITAL_COMMUNITY)
Admission: RE | Admit: 2019-11-27 | Discharge: 2019-11-27 | Disposition: A | Discharge status: Home / Self Care | Payer: Medicare Other | Attending: Neurological Surgery | Admitting: Neurological Surgery

## 2019-11-27 ENCOUNTER — Ambulatory Visit (HOSPITAL_COMMUNITY): Payer: Medicare Other | Admitting: Vascular Surgery

## 2019-11-27 ENCOUNTER — Ambulatory Visit (HOSPITAL_COMMUNITY): Payer: Medicare Other

## 2019-11-27 ENCOUNTER — Other Ambulatory Visit: Payer: Self-pay

## 2019-11-27 ENCOUNTER — Encounter (HOSPITAL_COMMUNITY): Payer: Self-pay | Admitting: Neurological Surgery

## 2019-11-27 ENCOUNTER — Encounter (HOSPITAL_COMMUNITY)
Admission: RE | Disposition: A | Discharge status: Home / Self Care | Payer: Self-pay | Source: Home / Self Care | Attending: Neurological Surgery

## 2019-11-27 DIAGNOSIS — F419 Anxiety disorder, unspecified: Secondary | ICD-10-CM | POA: Insufficient documentation

## 2019-11-27 DIAGNOSIS — M8008XA Age-related osteoporosis with current pathological fracture, vertebra(e), initial encounter for fracture: Secondary | ICD-10-CM | POA: Diagnosis not present

## 2019-11-27 DIAGNOSIS — S22000A Wedge compression fracture of unspecified thoracic vertebra, initial encounter for closed fracture: Secondary | ICD-10-CM | POA: Diagnosis present

## 2019-11-27 DIAGNOSIS — J449 Chronic obstructive pulmonary disease, unspecified: Secondary | ICD-10-CM | POA: Diagnosis not present

## 2019-11-27 DIAGNOSIS — Z6841 Body Mass Index (BMI) 40.0 and over, adult: Secondary | ICD-10-CM | POA: Diagnosis not present

## 2019-11-27 DIAGNOSIS — G894 Chronic pain syndrome: Secondary | ICD-10-CM | POA: Diagnosis not present

## 2019-11-27 DIAGNOSIS — F329 Major depressive disorder, single episode, unspecified: Secondary | ICD-10-CM | POA: Insufficient documentation

## 2019-11-27 DIAGNOSIS — G4730 Sleep apnea, unspecified: Secondary | ICD-10-CM | POA: Insufficient documentation

## 2019-11-27 DIAGNOSIS — I252 Old myocardial infarction: Secondary | ICD-10-CM | POA: Insufficient documentation

## 2019-11-27 DIAGNOSIS — Z981 Arthrodesis status: Secondary | ICD-10-CM | POA: Diagnosis not present

## 2019-11-27 DIAGNOSIS — Z9981 Dependence on supplemental oxygen: Secondary | ICD-10-CM | POA: Insufficient documentation

## 2019-11-27 DIAGNOSIS — Z79899 Other long term (current) drug therapy: Secondary | ICD-10-CM | POA: Insufficient documentation

## 2019-11-27 DIAGNOSIS — M8088XA Other osteoporosis with current pathological fracture, vertebra(e), initial encounter for fracture: Secondary | ICD-10-CM | POA: Diagnosis not present

## 2019-11-27 DIAGNOSIS — Z20822 Contact with and (suspected) exposure to covid-19: Secondary | ICD-10-CM | POA: Insufficient documentation

## 2019-11-27 DIAGNOSIS — F17210 Nicotine dependence, cigarettes, uncomplicated: Secondary | ICD-10-CM | POA: Diagnosis not present

## 2019-11-27 DIAGNOSIS — I10 Essential (primary) hypertension: Secondary | ICD-10-CM | POA: Insufficient documentation

## 2019-11-27 DIAGNOSIS — Z419 Encounter for procedure for purposes other than remedying health state, unspecified: Secondary | ICD-10-CM

## 2019-11-27 DIAGNOSIS — M4850XA Collapsed vertebra, not elsewhere classified, site unspecified, initial encounter for fracture: Secondary | ICD-10-CM | POA: Diagnosis present

## 2019-11-27 HISTORY — DX: Gastro-esophageal reflux disease without esophagitis: K21.9

## 2019-11-27 HISTORY — PX: KYPHOPLASTY: SHX5884

## 2019-11-27 HISTORY — DX: Depression, unspecified: F32.A

## 2019-11-27 HISTORY — DX: Presence of spectacles and contact lenses: Z97.3

## 2019-11-27 LAB — BASIC METABOLIC PANEL
Anion gap: 9 (ref 5–15)
BUN: 9 mg/dL (ref 8–23)
CO2: 29 mmol/L (ref 22–32)
Calcium: 8.9 mg/dL (ref 8.9–10.3)
Chloride: 99 mmol/L (ref 98–111)
Creatinine, Ser: 0.88 mg/dL (ref 0.61–1.24)
GFR calc Af Amer: >60 mL/min (ref >60)
GFR calc non Af Amer: >60 mL/min (ref >60)
Glucose, Bld: 104 mg/dL — ABNORMAL HIGH (ref 70–99)
Potassium: 3.8 mmol/L (ref 3.5–5.1)
Sodium: 137 mmol/L (ref 135–145)

## 2019-11-27 LAB — SARS CORONAVIRUS 2 BY RT PCR (HOSPITAL ORDER, PERFORMED IN ~~LOC~~ HOSPITAL LAB): SARS Coronavirus 2: NEGATIVE

## 2019-11-27 LAB — CBC
HCT: 41.1 % (ref 39.0–52.0)
Hemoglobin: 13.2 g/dL (ref 13.0–17.0)
MCH: 32.4 pg (ref 26.0–34.0)
MCHC: 32.1 g/dL (ref 30.0–36.0)
MCV: 100.7 fL — ABNORMAL HIGH (ref 80.0–100.0)
Platelets: 173 10*3/uL (ref 150–400)
RBC: 4.08 MIL/uL — ABNORMAL LOW (ref 4.22–5.81)
RDW: 15.6 % — ABNORMAL HIGH (ref 11.5–15.5)
WBC: 8.2 10*3/uL (ref 4.0–10.5)
nRBC: 0 % (ref 0.0–0.2)

## 2019-11-27 LAB — GLUCOSE, CAPILLARY
Glucose-Capillary: 95 mg/dL (ref 70–99)
Glucose-Capillary: 107 mg/dL — ABNORMAL HIGH (ref 70–99)
Glucose-Capillary: 100 mg/dL — ABNORMAL HIGH (ref 70–99)

## 2019-11-27 SURGERY — KYPHOPLASTY
Anesthesia: General | Site: Spine Thoracic

## 2019-11-27 MED ORDER — PHENOL 1.4 % MT LIQD: 1.0000 | OROMUCOSAL | Status: DC | PRN

## 2019-11-27 MED ORDER — MENTHOL 3 MG MT LOZG: 1.0000 | LOZENGE | OROMUCOSAL | Status: DC | PRN

## 2019-11-27 MED ORDER — IOPAMIDOL (ISOVUE-300) INJECTION 61%
INTRAVENOUS | Status: DC | PRN
  Administered 2019-11-27: 100 mL

## 2019-11-27 MED ORDER — FUROSEMIDE 20 MG PO TABS
20.0000 mg | ORAL_TABLET | Freq: Every day | ORAL | Status: DC
  Administered 2019-11-27: 40 mg via ORAL
  Filled 2019-11-27: qty 2

## 2019-11-27 MED ORDER — FENTANYL CITRATE (PF) 250 MCG/5ML IJ SOLN
INTRAMUSCULAR | Status: DC | PRN
  Administered 2019-11-27: 75 ug via INTRAVENOUS
  Administered 2019-11-27: 25 ug via INTRAVENOUS

## 2019-11-27 MED ORDER — PHENYLEPHRINE HCL-NACL 10-0.9 MG/250ML-% IV SOLN
INTRAVENOUS | Status: DC | PRN
  Administered 2019-11-27: 50 ug/min via INTRAVENOUS

## 2019-11-27 MED ORDER — TAMSULOSIN HCL 0.4 MG PO CAPS
0.4000 mg | ORAL_CAPSULE | Freq: Every day | ORAL | Status: DC
  Administered 2019-11-27: 0.4 mg via ORAL
  Filled 2019-11-27: qty 1

## 2019-11-27 MED ORDER — 0.9 % SODIUM CHLORIDE (POUR BTL) OPTIME
TOPICAL | Status: DC | PRN
  Administered 2019-11-27: 1000 mL

## 2019-11-27 MED ORDER — FENTANYL CITRATE (PF) 250 MCG/5ML IJ SOLN
INTRAMUSCULAR | Status: AC
  Filled 2019-11-27: qty 5

## 2019-11-27 MED ORDER — CHLORHEXIDINE GLUCONATE 0.12 % MT SOLN
15.0000 mL | Freq: Once | OROMUCOSAL | Status: AC
  Administered 2019-11-27: 15 mL via OROMUCOSAL
  Filled 2019-11-27: qty 15

## 2019-11-27 MED ORDER — DEXAMETHASONE SODIUM PHOSPHATE 10 MG/ML IJ SOLN
INTRAMUSCULAR | Status: DC | PRN
  Administered 2019-11-27: 10 mg via INTRAVENOUS

## 2019-11-27 MED ORDER — POLYETHYLENE GLYCOL 3350 17 G PO PACK: 17.0000 g | PACK | Freq: Every day | ORAL | Status: DC | PRN

## 2019-11-27 MED ORDER — OXYCODONE HCL 5 MG PO TABS
15.0000 mg | ORAL_TABLET | Freq: Four times a day (QID) | ORAL | Status: DC | PRN
  Administered 2019-11-27: 15 mg via ORAL
  Filled 2019-11-27: qty 3

## 2019-11-27 MED ORDER — LIDOCAINE 2% (20 MG/ML) 5 ML SYRINGE
INTRAMUSCULAR | Status: DC | PRN
  Administered 2019-11-27: 60 mg via INTRAVENOUS

## 2019-11-27 MED ORDER — SENNA 8.6 MG PO TABS
1.0000 | ORAL_TABLET | Freq: Two times a day (BID) | ORAL | Status: DC
  Administered 2019-11-27: 8.6 mg via ORAL
  Filled 2019-11-27: qty 1

## 2019-11-27 MED ORDER — LIDOCAINE 2% (20 MG/ML) 5 ML SYRINGE
INTRAMUSCULAR | Status: AC
  Filled 2019-11-27: qty 5

## 2019-11-27 MED ORDER — ONDANSETRON HCL 4 MG/2ML IJ SOLN: 4.0000 mg | Freq: Four times a day (QID) | INTRAMUSCULAR | Status: DC | PRN

## 2019-11-27 MED ORDER — DEXAMETHASONE SODIUM PHOSPHATE 10 MG/ML IJ SOLN
INTRAMUSCULAR | Status: AC
  Filled 2019-11-27: qty 1

## 2019-11-27 MED ORDER — ROCURONIUM BROMIDE 10 MG/ML (PF) SYRINGE
PREFILLED_SYRINGE | INTRAVENOUS | Status: DC | PRN
  Administered 2019-11-27: 50 mg via INTRAVENOUS

## 2019-11-27 MED ORDER — PROPOFOL 10 MG/ML IV BOLUS
INTRAVENOUS | Status: AC
  Filled 2019-11-27: qty 20

## 2019-11-27 MED ORDER — FLUTICASONE PROPIONATE 50 MCG/ACT NA SUSP
1.0000 | Freq: Every day | NASAL | Status: DC | PRN
  Filled 2019-11-27: qty 16

## 2019-11-27 MED ORDER — MIDAZOLAM HCL 5 MG/5ML IJ SOLN
INTRAMUSCULAR | Status: DC | PRN
  Administered 2019-11-27 (×2): 1 mg via INTRAVENOUS

## 2019-11-27 MED ORDER — SODIUM CHLORIDE 0.9 % IV SOLN: 250.0000 mL | INTRAVENOUS | Status: DC

## 2019-11-27 MED ORDER — MORPHINE SULFATE ER 30 MG PO TBCR: 60.0000 mg | EXTENDED_RELEASE_TABLET | Freq: Two times a day (BID) | ORAL | Status: DC | PRN

## 2019-11-27 MED ORDER — IPRATROPIUM-ALBUTEROL 0.5-2.5 (3) MG/3ML IN SOLN
RESPIRATORY_TRACT | Status: AC
  Filled 2019-11-27: qty 3

## 2019-11-27 MED ORDER — FENTANYL CITRATE (PF) 100 MCG/2ML IJ SOLN: 25.0000 ug | INTRAMUSCULAR | Status: DC | PRN

## 2019-11-27 MED ORDER — ALBUTEROL SULFATE (2.5 MG/3ML) 0.083% IN NEBU: 2.5000 mg | INHALATION_SOLUTION | Freq: Four times a day (QID) | RESPIRATORY_TRACT | Status: DC | PRN

## 2019-11-27 MED ORDER — FLEET ENEMA 7-19 GM/118ML RE ENEM: 1.0000 | ENEMA | Freq: Once | RECTAL | Status: DC | PRN

## 2019-11-27 MED ORDER — BUPIVACAINE HCL (PF) 0.5 % IJ SOLN
INTRAMUSCULAR | Status: DC | PRN
  Administered 2019-11-27: 5 mL

## 2019-11-27 MED ORDER — ACETAMINOPHEN 650 MG RE SUPP: 650.0000 mg | RECTAL | Status: DC | PRN

## 2019-11-27 MED ORDER — BUSPIRONE HCL 15 MG PO TABS
30.0000 mg | ORAL_TABLET | Freq: Three times a day (TID) | ORAL | Status: DC
  Filled 2019-11-27 (×2): qty 2

## 2019-11-27 MED ORDER — MIDAZOLAM HCL 2 MG/2ML IJ SOLN
INTRAMUSCULAR | Status: AC
  Filled 2019-11-27: qty 2

## 2019-11-27 MED ORDER — ROCURONIUM BROMIDE 10 MG/ML (PF) SYRINGE
PREFILLED_SYRINGE | INTRAVENOUS | Status: AC
  Filled 2019-11-27: qty 10

## 2019-11-27 MED ORDER — NALOXONE HCL 0.4 MG/ML IJ SOLN
INTRAMUSCULAR | Status: AC
  Filled 2019-11-27: qty 1

## 2019-11-27 MED ORDER — BISACODYL 10 MG RE SUPP: 10.0000 mg | Freq: Every day | RECTAL | Status: DC | PRN

## 2019-11-27 MED ORDER — METOPROLOL SUCCINATE ER 50 MG PO TB24
50.0000 mg | ORAL_TABLET | Freq: Every day | ORAL | Status: DC
  Administered 2019-11-27: 50 mg via ORAL
  Filled 2019-11-27: qty 1

## 2019-11-27 MED ORDER — LIDOCAINE-EPINEPHRINE 1 %-1:100000 IJ SOLN
INTRAMUSCULAR | Status: DC | PRN
  Administered 2019-11-27: 5 mL

## 2019-11-27 MED ORDER — GABAPENTIN 300 MG PO CAPS: 300.0000 mg | ORAL_CAPSULE | Freq: Three times a day (TID) | ORAL | Status: DC

## 2019-11-27 MED ORDER — TRAZODONE HCL 100 MG PO TABS
100.0000 mg | ORAL_TABLET | Freq: Every day | ORAL | Status: DC
  Filled 2019-11-27: qty 1

## 2019-11-27 MED ORDER — DOCUSATE SODIUM 100 MG PO CAPS
100.0000 mg | ORAL_CAPSULE | Freq: Two times a day (BID) | ORAL | Status: DC
  Administered 2019-11-27: 100 mg via ORAL
  Filled 2019-11-27: qty 1

## 2019-11-27 MED ORDER — METHOCARBAMOL 500 MG PO TABS: 500.0000 mg | ORAL_TABLET | Freq: Four times a day (QID) | ORAL | Status: DC | PRN

## 2019-11-27 MED ORDER — ACETAMINOPHEN 325 MG PO TABS: 650.0000 mg | ORAL_TABLET | ORAL | Status: DC | PRN

## 2019-11-27 MED ORDER — ACETAMINOPHEN 10 MG/ML IV SOLN: 1000.0000 mg | Freq: Once | INTRAVENOUS | Status: DC | PRN

## 2019-11-27 MED ORDER — ONDANSETRON HCL 4 MG/2ML IJ SOLN
INTRAMUSCULAR | Status: AC
  Filled 2019-11-27: qty 2

## 2019-11-27 MED ORDER — LACTATED RINGERS IV SOLN: INTRAVENOUS | Status: DC

## 2019-11-27 MED ORDER — DULOXETINE HCL 30 MG PO CPEP
60.0000 mg | ORAL_CAPSULE | Freq: Every day | ORAL | Status: DC
  Administered 2019-11-27: 60 mg via ORAL
  Filled 2019-11-27: qty 2

## 2019-11-27 MED ORDER — KETOROLAC TROMETHAMINE 15 MG/ML IJ SOLN
7.5000 mg | Freq: Four times a day (QID) | INTRAMUSCULAR | Status: DC
  Administered 2019-11-27: 7.5 mg via INTRAVENOUS
  Filled 2019-11-27: qty 1

## 2019-11-27 MED ORDER — ONDANSETRON HCL 4 MG/2ML IJ SOLN
INTRAMUSCULAR | Status: DC | PRN
  Administered 2019-11-27: 4 mg via INTRAVENOUS

## 2019-11-27 MED ORDER — ONDANSETRON HCL 4 MG PO TABS: 4.0000 mg | ORAL_TABLET | Freq: Four times a day (QID) | ORAL | Status: DC | PRN

## 2019-11-27 MED ORDER — LIDOCAINE-EPINEPHRINE 1 %-1:100000 IJ SOLN
INTRAMUSCULAR | Status: AC
  Filled 2019-11-27: qty 1

## 2019-11-27 MED ORDER — BUPIVACAINE HCL (PF) 0.5 % IJ SOLN
INTRAMUSCULAR | Status: AC
  Filled 2019-11-27: qty 30

## 2019-11-27 MED ORDER — CHLORHEXIDINE GLUCONATE CLOTH 2 % EX PADS: 6.0000 | MEDICATED_PAD | Freq: Once | CUTANEOUS | Status: DC

## 2019-11-27 MED ORDER — METHOCARBAMOL 1000 MG/10ML IJ SOLN
500.0000 mg | Freq: Four times a day (QID) | INTRAVENOUS | Status: DC | PRN
  Filled 2019-11-27: qty 5

## 2019-11-27 MED ORDER — SUGAMMADEX SODIUM 200 MG/2ML IV SOLN
INTRAVENOUS | Status: DC | PRN
  Administered 2019-11-27: 200 mg via INTRAVENOUS
  Administered 2019-11-27: 25 mg via INTRAVENOUS

## 2019-11-27 MED ORDER — ALBUTEROL SULFATE HFA 108 (90 BASE) MCG/ACT IN AERS
INHALATION_SPRAY | RESPIRATORY_TRACT | Status: AC
  Filled 2019-11-27: qty 6.7

## 2019-11-27 MED ORDER — ORAL CARE MOUTH RINSE: 15.0000 mL | Freq: Once | OROMUCOSAL | Status: AC

## 2019-11-27 MED ORDER — IPRATROPIUM-ALBUTEROL 0.5-2.5 (3) MG/3ML IN SOLN
3.0000 mL | Freq: Once | RESPIRATORY_TRACT | Status: AC
  Administered 2019-11-27: 3 mL via RESPIRATORY_TRACT

## 2019-11-27 MED ORDER — TIZANIDINE HCL 4 MG PO TABS: 4.0000 mg | ORAL_TABLET | Freq: Three times a day (TID) | ORAL | Status: DC

## 2019-11-27 MED ORDER — SODIUM CHLORIDE 0.9% FLUSH: 3.0000 mL | INTRAVENOUS | Status: DC | PRN

## 2019-11-27 MED ORDER — AMISULPRIDE (ANTIEMETIC) 5 MG/2ML IV SOLN: 10.0000 mg | Freq: Once | INTRAVENOUS | Status: DC | PRN

## 2019-11-27 MED ORDER — PROPOFOL 10 MG/ML IV BOLUS
INTRAVENOUS | Status: DC | PRN
  Administered 2019-11-27: 50 mg via INTRAVENOUS
  Administered 2019-11-27: 100 mg via INTRAVENOUS

## 2019-11-27 MED ORDER — SODIUM CHLORIDE 0.9% FLUSH: 3.0000 mL | Freq: Two times a day (BID) | INTRAVENOUS | Status: DC

## 2019-11-27 MED ORDER — ALUM & MAG HYDROXIDE-SIMETH 200-200-20 MG/5ML PO SUSP: 30.0000 mL | Freq: Four times a day (QID) | ORAL | Status: DC | PRN

## 2019-11-27 MED ORDER — ALBUTEROL SULFATE HFA 108 (90 BASE) MCG/ACT IN AERS: 1.0000 | INHALATION_SPRAY | Freq: Four times a day (QID) | RESPIRATORY_TRACT | Status: DC | PRN

## 2019-11-27 MED ORDER — MORPHINE SULFATE (PF) 2 MG/ML IV SOLN: 2.0000 mg | INTRAVENOUS | Status: DC | PRN

## 2019-11-27 SURGICAL SUPPLY — 40 items
ADH SKN CLS APL DERMABOND .7 (GAUZE/BANDAGES/DRESSINGS) ×1
BLADE SURG 11 STRL SS (BLADE) ×3 IMPLANT
BNDG ADH 1X3 SHEER STRL LF (GAUZE/BANDAGES/DRESSINGS) ×12 IMPLANT
BNDG ADH THN 3X1 STRL LF (GAUZE/BANDAGES/DRESSINGS) ×4
CEMENT BONE KYPHX HV R (Orthopedic Implant) ×2 IMPLANT
CNTNR URN SCR LID CUP LEK RST (MISCELLANEOUS) IMPLANT
CONT SPEC 4OZ STRL OR WHT (MISCELLANEOUS) ×3
DERMABOND ADVANCED (GAUZE/BANDAGES/DRESSINGS) ×2
DERMABOND ADVANCED .7 DNX12 (GAUZE/BANDAGES/DRESSINGS) IMPLANT
DRAPE C-ARM 42X72 X-RAY (DRAPES) ×5 IMPLANT
DRAPE HALF SHEET 40X57 (DRAPES) ×3 IMPLANT
DRAPE INCISE IOBAN 66X45 STRL (DRAPES) ×3 IMPLANT
DRAPE LAPAROTOMY 100X72X124 (DRAPES) ×3 IMPLANT
DRAPE WARM FLUID 44X44 (DRAPES) ×3 IMPLANT
DURAPREP 26ML APPLICATOR (WOUND CARE) ×3 IMPLANT
GAUZE 4X4 16PLY RFD (DISPOSABLE) ×3 IMPLANT
GLOVE BIOGEL PI IND STRL 8 (GLOVE) IMPLANT
GLOVE BIOGEL PI IND STRL 8.5 (GLOVE) ×1 IMPLANT
GLOVE BIOGEL PI INDICATOR 8 (GLOVE) ×6
GLOVE BIOGEL PI INDICATOR 8.5 (GLOVE) ×4
GLOVE ECLIPSE 7.5 STRL STRAW (GLOVE) ×6 IMPLANT
GLOVE ECLIPSE 8.5 STRL (GLOVE) ×5 IMPLANT
GOWN STRL REUS W/ TWL LRG LVL3 (GOWN DISPOSABLE) IMPLANT
GOWN STRL REUS W/ TWL XL LVL3 (GOWN DISPOSABLE) ×1 IMPLANT
GOWN STRL REUS W/TWL 2XL LVL3 (GOWN DISPOSABLE) ×7 IMPLANT
GOWN STRL REUS W/TWL LRG LVL3 (GOWN DISPOSABLE)
GOWN STRL REUS W/TWL XL LVL3 (GOWN DISPOSABLE)
KIT BASIN OR (CUSTOM PROCEDURE TRAY) ×3 IMPLANT
KIT TURNOVER KIT B (KITS) ×3 IMPLANT
NDL HYPO 25X1 1.5 SAFETY (NEEDLE) ×1 IMPLANT
NEEDLE HYPO 25X1 1.5 SAFETY (NEEDLE) ×3 IMPLANT
NS IRRIG 1000ML POUR BTL (IV SOLUTION) ×3 IMPLANT
PACK SURGICAL SETUP 50X90 (CUSTOM PROCEDURE TRAY) ×3 IMPLANT
SUT VIC AB 4-0 P-3 18X BRD (SUTURE) ×1 IMPLANT
SUT VIC AB 4-0 P3 18 (SUTURE) ×3
SUT VIC AB 4-0 RB1 18 (SUTURE) ×6 IMPLANT
SYR CONTROL 10ML LL (SYRINGE) ×6 IMPLANT
TOWEL GREEN STERILE (TOWEL DISPOSABLE) ×3 IMPLANT
TOWEL GREEN STERILE FF (TOWEL DISPOSABLE) ×3 IMPLANT
TRAY KYPHOPAK 15/3 ONESTEP 1ST (MISCELLANEOUS) ×4 IMPLANT

## 2019-11-27 NOTE — H&P (Signed)
[name] is an 64 y.o. male.   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  He has had extensive surgery in these areas and has also morbid obesity in addition to significant pulmonary insufficiency and is on home oxygen.  He had a fall several months ago that is caused an increase amount of pain in his back and an MRI recently demonstrates that he has subacute compression fractures at T11-T12 and L1.  Having failed efforts at conservative management and having substantial problems with back pain I advised that we could treat his fractures with acrylic balloon kyphoplasty's.  He is now being admitted for that procedure.    Past Medical History:  Diagnosis Date  . Anxiety    went off medicines for anxiety 6 months ago ( 12/2010), pt. "stays nervous", per pt.   . Arthritis    lumbar stenosis   . Benign prostatic hyperplasia with lower urinary tract symptoms   . Chronic pain syndrome   . Chronic pansinusitis   . Chronic pharyngitis   . Compression fracture of thoracic vertebra (HCC)    T11,T12 and L1  . Contusion of thorax, unspecified, initial encounter   . COPD (chronic obstructive pulmonary disease) (HCC)   . Cysts    groin area (on & off- for 4 +yrs.) for which he uses Doxycyline on going, BID, Rx fr. Dr. Fatima Blank in [place], [place].,    . Dependence on supplemental oxygen   . Depression   . Diabetes mellitus (HCC)    denied DM2 history 11/24/19; A1c 6.1% 02/02/18 (pre-diabetes)  . Drug induced constipation   . Dysphagia   . Finger infection    05/2011, L hand, index finger  - I&D in MD office, treated /w antibiotic.Marland KitchenMarland KitchenMarland Kitchen?name  . GERD (gastroesophageal reflux disease)   . Headache(784.0)    uses excedrin prn  . History of colon polyps   . History of normal resting EKG    2009, preop  . Hypertension   . Long term (current) use of opiate  analgesic   . Myocardial infarction [facility])    denied MI, but reported LVF mildly decreased in the past 11/24/19; LVEF 50-55% 2018 by echo, LVEF 48% on non-ischemic Dobutamine stress test 01/2015  . Neuromuscular disorder (HCC)    pseudoarthrosis   . Normal cardiac stress test    2005, done prior to back surgery  . Other chronic nonsuppurative otitis media, bilateral   . Shortness of breath    pt. reports related to pain   . Sleep apnea    study- 2012[facility] , Dr. Blenda Nicely, [address]  , uses CPAP  qo night   . Wears glasses     Past Surgical History:  Procedure Laterality Date  . APPENDECTOMY     as a teen   . BACK SURGERY     multiple, lumbar & cerv. fusions, last low back- 2009  . COLONOSCOPY  06/06/2010   Colon polyps- status post polypectomy. Moderate predominantly sigmoid diverticulosis. Internal hemorrhoids.  . ESOPHAGOGASTRODUODENOSCOPY  06/14/2008   Mild gastritis. Otherwise normal EGD.   Marland Kitchen HEMORROIDECTOMY     2012[facility]  . NECK SURGERY      Family History  Problem Relation Age of Onset  . Alcoholism Mother   . Alcoholism Father   . Coronary artery disease Father   . Hypertension Father   . Anesthesia  problems Neg Hx    Social History:  reports that he has been smoking. He has been smoking about 1.00 pack per day. He has never used smokeless tobacco. He reports previous alcohol use. He reports that he does not use drugs.  Allergies:  Allergies  Allergen Reactions  . Strawberry Extract Rash    Medications Prior to Admission  Medication Sig Dispense Refill  . albuterol (PROVENTIL) (2.5 MG/3ML) 0.083% nebulizer solution Take 2.5 mg by nebulization every 6 (six) hours as needed for wheezing or shortness of breath.    Marland Kitchen albuterol (VENTOLIN HFA) 108 (90 Base) MCG/ACT inhaler Inhale 1-2 puffs into the lungs every 6 (six) hours as needed for wheezing or shortness of breath.    . Aspirin-Acetaminophen-Caffeine (GOODYS EXTRA STRENGTH)  [phone] MG PACK Take 1 packet by mouth daily as needed (pain).    . busPIRone (BUSPAR) 30 MG tablet Take 30 mg by mouth 3 (three) times daily.    . cholecalciferol (VITAMIN D3) 25 MCG (1000 UNIT) tablet Take 1,000 Units by mouth daily.    . DULoxetine (CYMBALTA) 60 MG capsule Take 60 mg by mouth daily.    . fluticasone (FLONASE) 50 MCG/ACT nasal spray Place 1 spray into both nostrils daily as needed for allergies or rhinitis.    . furosemide (LASIX) 20 MG tablet Take 20-40 mg by mouth daily.    Marland Kitchen gabapentin (NEURONTIN) 300 MG capsule Take 300 mg by mouth 3 (three) times daily.    . metoprolol succinate (TOPROL-XL) 50 MG 24 hr tablet Take 50 mg by mouth daily. Take with or immediately following a meal.    . morphine (MS CONTIN) 60 MG 12 hr tablet Take 60 mg by mouth 2 (two) times daily as needed.    Marland Kitchen oxyCODONE (ROXICODONE) 15 MG immediate release tablet Take 15 mg by mouth 4 (four) times daily as needed.    . Tamsulosin HCl (FLOMAX) 0.4 MG CAPS Take 0.4 mg by mouth daily.     Marland Kitchen tiZANidine (ZANAFLEX) 4 MG capsule Take 4 mg by mouth 3 (three) times daily.    . traZODone (DESYREL) 50 MG tablet Take 100 mg by mouth at bedtime. For sleep      Results for orders placed or performed during the hospital encounter of 11/27/19 (from the past 48 hour(s))  CBC     Status: Abnormal   Collection Time: 11/27/19  6:06 AM  Result Value Ref Range   WBC 8.2 4.0 - 10.5 K/uL   RBC 4.08 (L) 4.22 - 5.81 MIL/uL   Hemoglobin 13.2 13.0 - 17.0 g/dL   HCT 59.5 39 - 52 %   MCV 100.7 (H) 80.0 - 100.0 fL   MCH 32.4 26.0 - 34.0 pg   MCHC 32.1 30.0 - 36.0 g/dL   RDW 63.8 (H) 75.6 - 43.3 %   Platelets 173 150 - 400 K/uL   nRBC 0.0 0.0 - 0.2 %    Comment: Performed at [facility], [address] [place], [place] [address]  Basic metabolic panel     Status: Abnormal   Collection Time: 11/27/19  6:06 AM  Result Value Ref Range   Sodium 137 135 - 145 mmol/L   Potassium 3.8 3.5 - 5.1 mmol/L   Chloride 99 98 -  111 mmol/L   CO2 29 22 - 32 mmol/L   Glucose, Bld 104 (H) 70 - 99 mg/dL    Comment: Glucose reference range applies only to samples taken after fasting for at least  8 hours.   BUN 9 8 - 23 mg/dL   Creatinine, Ser 9.21 0.61 - 1.24 mg/dL   Calcium 8.9 8.9 - 19.4 mg/dL   GFR calc non Af Amer >60 >60 mL/min   GFR calc Af Amer >60 >60 mL/min   Anion gap 9 5 - 15    Comment: Performed at [facility], [address], [place], [place] [address]  SARS Coronavirus 2 by RT PCR (hospital order, performed in [facility] hospital lab) Nasopharyngeal Nasopharyngeal Swab     Status: None   Collection Time: 11/27/19  6:13 AM   Specimen: Nasopharyngeal Swab  Result Value Ref Range   SARS Coronavirus 2 NEGATIVE NEGATIVE    Comment: (NOTE) SARS-CoV-2 target nucleic acids are NOT DETECTED.  The SARS-CoV-2 RNA is generally detectable in upper and lower respiratory specimens during the acute phase of infection. The lowest concentration of SARS-CoV-2 viral copies this assay can detect is 250 copies / mL. A negative result does not preclude SARS-CoV-2 infection and should not be used as the sole basis for treatment or other patient management decisions.  A negative result may occur with improper specimen collection / handling, submission of specimen other than nasopharyngeal swab, presence of viral mutation(s) within the areas targeted by this assay, and inadequate number of viral copies (<250 copies / mL). A negative result must be combined with clinical observations, patient history, and epidemiological information.  Fact Sheet for Patients:   BoilerBrush.com.cy  Fact Sheet for Healthcare Providers: https://pope.com/  This test is not yet approved or  cleared by the [place] FDA and has been authorized for detection and/or diagnosis of SARS-CoV-2 by FDA under an Emergency Use Authorization (EUA).  This EUA will remain in effect (meaning this  test can be used) for the duration of the COVID-19 declaration under Section 564(b)(1) of the Act, 21 U.S.C. section 360bbb-3(b)(1), unless the authorization is terminated or revoked sooner.  Performed at [facility], [address], [place], [place] [address]   Glucose, capillary     Status: None   Collection Time: 11/27/19  6:23 AM  Result Value Ref Range   Glucose-Capillary 95 70 - 99 mg/dL    Comment: Glucose reference range applies only to samples taken after fasting for at least 8 hours.   No results found.  Review of Systems  Constitutional: Positive for activity change.  HENT: Negative.   Eyes: Negative.   Respiratory: Positive for shortness of breath.   Cardiovascular: Negative.   Gastrointestinal: Negative.   Endocrine: Negative.   Genitourinary: Negative.   Musculoskeletal: Positive for back pain.  Allergic/Immunologic: Negative.   Neurological: Positive for weakness and numbness.  Psychiatric/Behavioral: Negative.     Blood pressure 116/69, pulse 65, temperature 97.8 F (36.6 C), temperature source Temporal, resp. rate 18, height 5\' 6"  (1.676 m), weight (!) 113.4 kg, SpO2 95 %. Physical Exam Constitutional:      Appearance: He is obese.  HENT:     Head: Normocephalic and atraumatic.     Nose: Nose normal.     Mouth/Throat:     Mouth: Mucous membranes are moist.  Eyes:     Extraocular Movements: Extraocular movements intact.  Neck:     Comments: Decreased range of motion of the cervical spine Cardiovascular:     Pulses: Normal pulses.     Heart sounds: Normal heart sounds.  Pulmonary:     Effort: Pulmonary effort is normal.     Breath sounds: Normal breath  sounds.  Abdominal:     General: Bowel sounds are normal.     Palpations: Abdomen is soft.  Musculoskeletal:        General: Tenderness present.     Cervical back: Neck supple.  Skin:    General: Skin is warm and dry.     Capillary Refill: Capillary refill takes less than 2 seconds.   Neurological:     Mental Status: He is alert and oriented to person, place, and time.     Comments: Cranial nerves are normal.  Motor strength the upper extremities reveals 4 out of 5 strength in the deltoids biceps triceps grips and intrinsics tone and bulk in the major muscle groups are intact.  His gait is exceptionally wide-based.  He demonstrates marked weakness in the lower extremities in the iliopsoas to quadriceps tibialis anterior and gastrocs all at 4 out of 5 deep tendon reflexes are absent in both the biceps triceps the patellae and the Achilles.  Psychiatric:        Mood and Affect: Mood normal.        Behavior: Behavior normal.      Assessment/Plan Subacute compression fractures of T11-T12 and L1 with intractable back pain.  Plan: Acrylic balloon kyphoplasty's T11-T12 and L1.  Stefani Dama, MD 11/27/2019, 8:24 AM

## 2019-11-27 NOTE — Op Note (Signed)
Date of surgery: 11/27/2019 Preoperative diagnosis: Subacute compression fractures T11-T12 and L1, osteoporosis.  History of previous lumbar fusion L2 to the sacrum Postoperative diagnosis: Same Procedure: Acrylic balloon kyphoplasty T11-T12 and L1, biplane fluoroscopic imaging. Surgeon: Barnett Abu Anesthesia: General endotracheal Indications: [name] is a 64 year old individual who is had a history of worsening back pain over the past few months time.  Plain x-rays did not reveal much change but a MRI of the thoracic spine demonstrated the presence of subacute compression fractures of T11-T12 and L1.  Is been advised regarding acrylic balloon kyphoplasty.  Patient has a significant premorbid history of morbid obesity in addition to poor pulmonary status.  Procedure: Patient was brought to the operating room supine on the stretcher.  After the smooth induction of general endotracheal anesthesia he was carefully turned prone.  The back was prepped with alcohol DuraPrep but prior to this biplane fluoroscopy was put in place to isolate the region of the lower thoracic spine and the thoracolumbar junction.  Then after prepping with alcohol DuraPrep of the area was draped sterilely and the fluoroscopic machines were in place.  By localizing the lateral aspect of the pedicle on the left side at T11 and L1 a Jamshidi needle was placed into the lateral aspect of the vertebral body using direct fluoroscopic imaging.  When the posterior wall of the vertebral body was pierced a drill was used to create a hole and inserted the balloon.  T12 was instrumented in the same fashion from the right side.  Balloons were inserted into the  vertebrae at L1 the balloon was inflated to 250 mmHg in the little distraction of the vertebral endplate was noted.  At T11 produced significant distraction a total of 3 cc of contrast was placed into the balloon at T12 similar distraction was found at but only 2-1/2 cc of  dye was placed into the balloon.  Cement was mixed to the appropriate consistency and at T11 we inserted a total volume of 4-1/2 mL of cement.  At T12 we were able to insert a total volume of 3 cc of cement before significant extravasation of the cement was noted into the disc space at T11-T12.  At L1 we were able to insert a total volume of 3 cc of cement which filled the vertebrae equally on the left and right sides.  Final radiographs were obtained and when the cement was allowed to harden the cannulas were removed.  No tails of cement were encountered.  With this the procedure was ended by placing subcuticular stitches of 4-0 Vicryl and covering the incisions with Dermabond.  Patient was returned to recovery room in stable condition blood loss for the entire procedure was estimated at 20 cc.

## 2019-11-27 NOTE — Anesthesia Procedure Notes (Signed)
Procedure Name: Intubation Date/Time: 11/27/2019 8:48 AM Performed by: Adria Dill, CRNA Pre-anesthesia Checklist: Patient identified, Emergency Drugs available, Suction available and Patient being monitored Patient Re-evaluated:Patient Re-evaluated prior to induction Oxygen Delivery Method: Circle system utilized Preoxygenation: Pre-oxygenation with 100% oxygen Induction Type: IV induction Ventilation: Mask ventilation without difficulty Laryngoscope Size: Miller and 3 Grade View: Grade I Tube type: Oral Tube size: 7.5 mm Number of attempts: 1 Airway Equipment and Method: Stylet and Oral airway Placement Confirmation: ETT inserted through vocal cords under direct vision,  positive ETCO2 and breath sounds checked- equal and bilateral Secured at: 22 cm Tube secured with: Tape Dental Injury: Teeth and Oropharynx as per pre-operative assessment

## 2019-11-27 NOTE — Discharge Summary (Signed)
Physician Discharge Summary  Patient ID: [name] MRN: [id number] DOB/AGE: 09-27-1955 64 y.o.  Admit date: 11/27/2019 Discharge date: 11/27/2019  Admission Diagnoses: Osteoporosis, compression fracture T11 T12-L1, subacute  Discharge Diagnoses: Osteoporosis, compression fracture T11-T12 and L1, subacute Active Problems:   Compression fracture of body of thoracic vertebra [facility])   Discharged Condition: fair  Hospital Course: Patient was admitted to undergo acrylic balloon kyphoplasty which he tolerated well  Consults: None  Significant Diagnostic Studies: None  Treatments: surgery: Acrylic balloon kyphoplasty T11-T12 and L1  Discharge Exam: Blood pressure 133/78, pulse 61, temperature 98.1 F (36.7 C), temperature source Oral, resp. rate 17, height 5\' 6"  (1.676 m), weight (!) 113.4 kg, SpO2 95 %. Ambulation status is intact when patient walks with a walker.  Overall strength graded at 4 out of 5.  Incisions clean and dry.  Disposition: Discharge disposition: 01-Home or Self Care       Discharge Instructions    Call MD for:  redness, tenderness, or signs of infection (pain, swelling, redness, odor or green/yellow discharge around incision site)   Complete by: As directed    Call MD for:  severe uncontrolled pain   Complete by: As directed    Call MD for:  temperature >100.4   Complete by: As directed    Diet - low sodium heart healthy   Complete by: As directed    Incentive spirometry RT   Complete by: As directed    Incentive spirometry RT   Complete by: As directed    Increase activity slowly   Complete by: As directed    No wound care   Complete by: As directed      Allergies as of 11/27/2019      Reactions   Strawberry Extract Rash      Medication List    TAKE these medications   albuterol 108 (90 Base) MCG/ACT inhaler Commonly known as: VENTOLIN HFA Inhale 1-2 puffs into the lungs every 6 (six) hours as needed for wheezing or shortness of breath.    albuterol (2.5 MG/3ML) 0.083% nebulizer solution Commonly known as: PROVENTIL Take 2.5 mg by nebulization every 6 (six) hours as needed for wheezing or shortness of breath.   busPIRone 30 MG tablet Commonly known as: BUSPAR Take 30 mg by mouth 3 (three) times daily.   cholecalciferol 25 MCG (1000 UNIT) tablet Commonly known as: VITAMIN D3 Take 1,000 Units by mouth daily.   DULoxetine 60 MG capsule Commonly known as: CYMBALTA Take 60 mg by mouth daily.   fluticasone 50 MCG/ACT nasal spray Commonly known as: FLONASE Place 1 spray into both nostrils daily as needed for allergies or rhinitis.   furosemide 20 MG tablet Commonly known as: LASIX Take 20-40 mg by mouth daily.   gabapentin 300 MG capsule Commonly known as: NEURONTIN Take 300 mg by mouth 3 (three) times daily.   Goodys Extra Strength 01/27/2020 MG Pack Generic drug: Aspirin-Acetaminophen-Caffeine Take 1 packet by mouth daily as needed (pain).   metoprolol succinate 50 MG 24 hr tablet Commonly known as: TOPROL-XL Take 50 mg by mouth daily. Take with or immediately following a meal.   morphine 60 MG 12 hr tablet Commonly known as: MS CONTIN Take 60 mg by mouth 2 (two) times daily as needed.   oxyCODONE 15 MG immediate release tablet Commonly known as: ROXICODONE Take 15 mg by mouth 4 (four) times daily as needed.   tamsulosin 0.4 MG Caps capsule Commonly known as: FLOMAX Take 0.4 mg by mouth  daily.   tiZANidine 4 MG capsule Commonly known as: ZANAFLEX Take 4 mg by mouth 3 (three) times daily.   traZODone 50 MG tablet Commonly known as: DESYREL Take 100 mg by mouth at bedtime. For sleep        Signed: Stefani Dama 11/27/2019, 1:48 PM

## 2019-11-27 NOTE — Anesthesia Postprocedure Evaluation (Signed)
Anesthesia Post Note  Patient: [name]  Procedure(s) Performed: THORACIC ELEVEN, THORACIC TWELVE, LUMBAR ONE KYPHOPLASTY (N/A Spine Thoracic)     Patient location during evaluation: PACU Anesthesia Type: General Level of consciousness: awake and alert Pain management: pain level controlled Vital Signs Assessment: post-procedure vital signs reviewed and stable Respiratory status: spontaneous breathing, nonlabored ventilation, respiratory function stable and patient connected to nasal cannula oxygen Cardiovascular status: blood pressure returned to baseline and stable Postop Assessment: no apparent nausea or vomiting Anesthetic complications: no   No complications documented.  Last Vitals:  Vitals:   11/27/19 1128 11/27/19 1149  BP: 99/85 133/78  Pulse: (!) 58 61  Resp: 20 17  Temp: 36.8 C   SpO2: 95% 95%    Last Pain:  Vitals:   11/27/19 1100  TempSrc:   PainSc: Asleep                 Kennieth Rad

## 2019-11-27 NOTE — Progress Notes (Signed)
Patient is discharged from room 3C07 at this time. Alert and in stable condition. IV site d/c'd and instructions read to patient and spouse with understanding verbalized and all answered. Left unit via wheelchair with all belongings at side.

## 2019-11-27 NOTE — Evaluation (Signed)
Physical Therapy Evaluation  Patient Details Name: [name] MRN: [id number] DOB: [birth year] Today's Date: 11/27/2019   History of Present Illness  pt is a 64 yo male who is s/p Kyphoplasty of T11-L1. Pt has had L2 fusion surgery. Pt has PMH of Chronic pain syndrome, COPD, T2DM, HTN, and is on supplimental oxygen  Clinical Impression  Pt was evaluated and assessed with the above diagnosis and the impairments listed below. Pt required supervision with all bed mobility. Pt required supervision to min guard assistance with transfers, ambulation and stair navigation. Pt was educated on generalized walking program and precautions. Recommend HHPT for this pt; however, pt is refusing HHPT services at this time. Pt and wife were comfortable with him going home today. Pt would continue to benefit from acute therapy services to ensure safety with functional tasks. Will continue to follow acutely.     Follow Up Recommendations Other (comment) (pt refusing HHPT )    Equipment Recommendations  None recommended by PT    Recommendations for Other Services       Precautions / Restrictions Precautions Precautions: Back Precaution Booklet Issued: Yes (comment) Precaution Comments: reviewed spinal precautions with pt  Restrictions Weight Bearing Restrictions: No      Mobility  Bed Mobility Overal bed mobility: Needs Assistance Bed Mobility: Rolling;Sidelying to Sit;Sit to Sidelying Rolling: Supervision Sidelying to sit: Supervision;HOB elevated     Sit to sidelying: Supervision General bed mobility comments: pt required supervision with HOB elevated along with use of railings.   Transfers Overall transfer level: Needs assistance Equipment used: Rolling walker (2 wheeled) Transfers: Sit to/from Stand Sit to Stand: Min guard         General transfer comment: pt required min guard assist and a RW with sit<>stand transfer. Pt required verbal cue for proper hand placement for power up and  proximity to the device.  increased time for power up from bed  Ambulation/Gait Ambulation/Gait assistance: Min guard Gait Distance (Feet): 70 Feet Assistive device: Rolling walker (2 wheeled) Gait Pattern/deviations: Step-through pattern;Decreased step length - right;Decreased step length - left;Decreased stride length;Decreased dorsiflexion - right;Decreased dorsiflexion - left Gait velocity: decreased   General Gait Details: pt was very slow and guarded with ambulation. pt was cued for standing tall with ambulation along with RW safety and proximity to the device.   Stairs Stairs: Yes Stairs assistance: Min guard Stair Management: Step to pattern;Forwards;One rail Right Number of Stairs: 4 General stair comments: pt demonstrated slow, guard stair navigation with utilizing the R railing.  Pt was educated on alternative stair navigation techniques, including sideways navigation  Wheelchair Mobility    Modified Rankin (Stroke Patients Only)       Balance Overall balance assessment: Needs assistance Sitting-balance support: Feet supported;No upper extremity supported Sitting balance-Leahy Scale: Fair Sitting balance - Comments: pt was able to sit EOB without UE support on bed   Standing balance support: During functional activity;Single extremity supported Standing balance-Leahy Scale: Poor Standing balance comment: pt required support from at least one UE for stability in standing                             Pertinent Vitals/Pain Pain Assessment: Faces Faces Pain Scale: Hurts even more Pain Location: back Pain Descriptors / Indicators: Discomfort;Operative site guarding Pain Intervention(s): Limited activity within patient's tolerance;Monitored during session;Premedicated before session    Home Living Family/patient expects to be discharged to:: Private residence Living Arrangements: Spouse/significant other  Available Help at Discharge: Family Type of Home:  Mobile home Home Access: Stairs to enter Entrance Stairs-Rails: Can reach both Entrance Stairs-Number of Steps: 3 Home Layout: One level Home Equipment: Walker - 2 wheels;Cane - single point;Bedside commode;Grab bars - tub/shower;Shower seat      Prior Function Level of Independence: Independent with assistive device(s)         Comments: pt requires use of RW at home     Hand Dominance        Extremity/Trunk Assessment   Upper Extremity Assessment Upper Extremity Assessment: Overall WFL for tasks assessed    Lower Extremity Assessment Lower Extremity Assessment: Generalized weakness    Cervical / Trunk Assessment Cervical / Trunk Assessment: Kyphotic;Other exceptions Cervical / Trunk Exceptions: s/p kyphoplasty   Communication   Communication: HOH  Cognition Arousal/Alertness: Awake/alert Behavior During Therapy: WFL for tasks assessed/performed Overall Cognitive Status: Within Functional Limits for tasks assessed                                 General Comments: pt was [facility] and wife had to get his attention to answer some questions      General Comments General comments (skin integrity, edema, etc.): pt was on 2L of supplemental O2     Exercises     Assessment/Plan    PT Assessment Patient needs continued PT services  PT Problem List Decreased strength;Decreased range of motion;Decreased activity tolerance;Decreased balance;Decreased mobility;Decreased coordination;Decreased knowledge of use of DME;Decreased safety awareness;Pain       PT Treatment Interventions DME instruction;Gait training;Stair training;Therapeutic activities;Functional mobility training;Balance training;Therapeutic exercise;Neuromuscular re-education;Patient/family education;Modalities    PT Goals (Current goals can be found in the Care Plan section)  Acute Rehab PT Goals Patient Stated Goal: get home  PT Goal Formulation: With patient/family Time For Goal Achievement:  12/11/19 Potential to Achieve Goals: Good    Frequency Min 5X/week   Barriers to discharge        Co-evaluation               AM-PAC PT "6 Clicks" Mobility  Outcome Measure Help needed turning from your back to your side while in a flat bed without using bedrails?: A Little Help needed moving from lying on your back to sitting on the side of a flat bed without using bedrails?: A Little Help needed moving to and from a bed to a chair (including a wheelchair)?: A Little Help needed standing up from a chair using your arms (e.g., wheelchair or bedside chair)?: A Little Help needed to walk in hospital room?: A Little Help needed climbing 3-5 steps with a railing? : A Little 6 Click Score: 18    End of Session Equipment Utilized During Treatment: Gait belt Activity Tolerance: Patient limited by pain Patient left: in bed;with call [name]/phone within reach;with family/visitor present Nurse Communication: Mobility status PT Visit Diagnosis: Unsteadiness on feet (R26.81);Pain;Difficulty in walking, not elsewhere classified (R26.2) Pain - part of body:  (back)    Time: 1256-1330 PT Time Calculation (min) (ACUTE ONLY): 34 min   Charges:   PT Evaluation $PT Eval Low Complexity: 1 Low PT Treatments $Gait Training: 8-22 mins       Harmon Pier, SPT  Acute Rehabilitation Services  Office: [phone]  11/27/2019, 3:02 PM

## 2019-11-27 NOTE — Discharge Instructions (Signed)

## 2019-11-27 NOTE — Transfer of Care (Signed)
Immediate Anesthesia Transfer of Care Note  Patient: [name]  Procedure(s) Performed: THORACIC ELEVEN, THORACIC TWELVE, LUMBAR ONE KYPHOPLASTY (N/A Spine Thoracic)  Patient Location: PACU  Anesthesia Type:General  Level of Consciousness: awake and patient cooperative  Airway & Oxygen Therapy: Patient Spontanous Breathing and Patient connected to face mask oxygen  Post-op Assessment: Report given to RN and Post -op Vital signs reviewed and stable  Post vital signs: Reviewed and stable  Last Vitals:  Vitals Value Taken Time  BP 100/88 11/27/19 1001  Temp    Pulse 80 11/27/19 1002  Resp 16 11/27/19 1002  SpO2 94 % 11/27/19 1002  Vitals shown include unvalidated device data.  Last Pain:  Vitals:   11/27/19 0702  TempSrc:   PainSc: 9       Patients Stated Pain Goal: 3 (11/27/19 9509)  Complications: No complications documented.

## 2019-11-28 ENCOUNTER — Encounter (HOSPITAL_COMMUNITY): Payer: Self-pay | Admitting: Neurological Surgery

## 2020-02-16 DIAGNOSIS — Z9889 Other specified postprocedural states: Secondary | ICD-10-CM | POA: Insufficient documentation

## 2020-02-16 DIAGNOSIS — Z6841 Body Mass Index (BMI) 40.0 and over, adult: Secondary | ICD-10-CM | POA: Insufficient documentation

## 2020-12-11 IMAGING — RF DG THORACOLUMBAR SPINE 2V
1 series · 1 of 1 positions shown · IV contrast (agent unspecified)
Comparison: October 28, 2019.

CLINICAL DATA: Kyphoplasty of T11, T12 and L1.

EXAM:
DG C-ARM 1-60 MIN; THORACOLUMBAR SPINE - 2 VIEW
CONTRAST:  None.
FLUOROSCOPY TIME:  Radiation Exposure Index (if provided by the
fluoroscopic device): 110.13 mGy.

[Series 1: run · 1 of 1 slices shown]
[im 1/1]
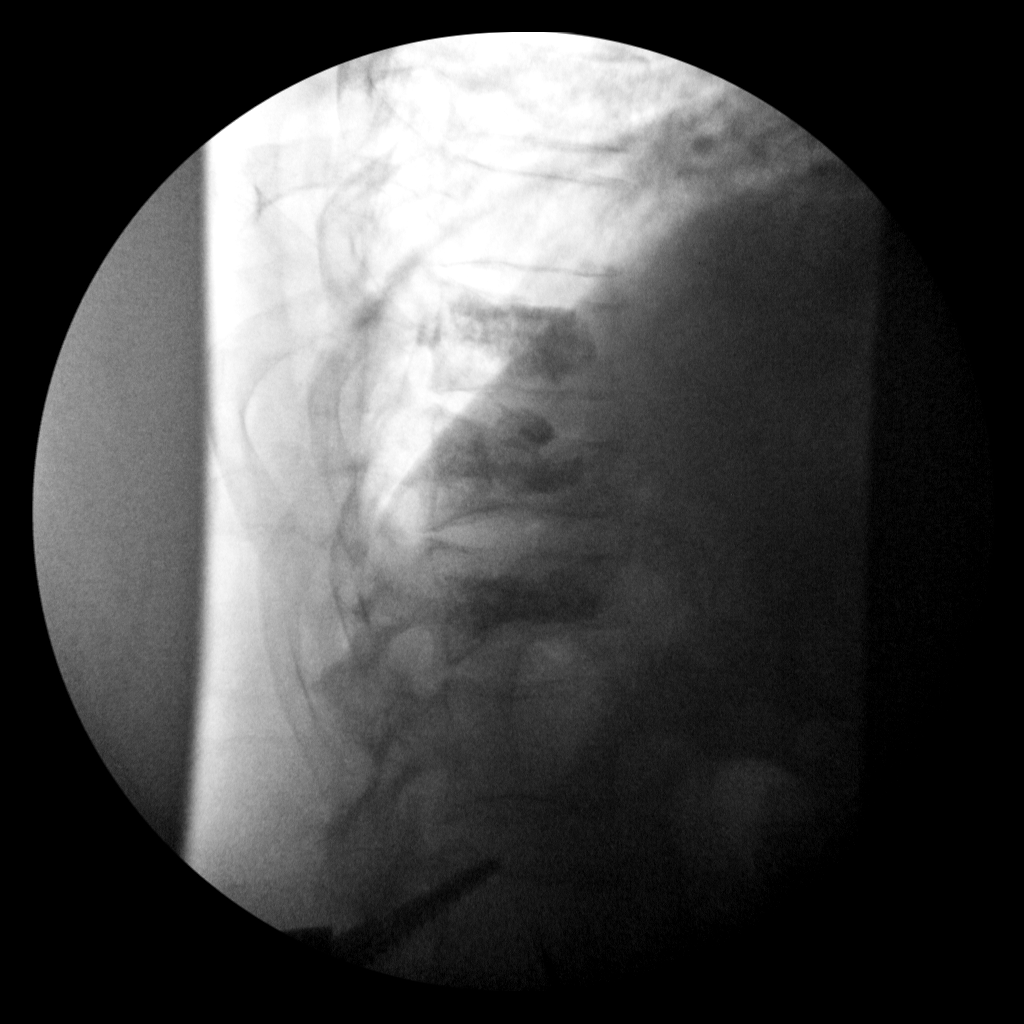

[1 of 1 positions shown; findings below may reference images not displayed]

FINDINGS: Single intraoperative fluoroscopic image was obtained of the
thoracolumbar spine. Patient appears to be status post kyphoplasty
of the T11, T12 and L1 vertebral bodies.
IMPRESSION: Fluoroscopic guidance provided during kyphoplasty at multiple
levels.

## 2021-01-02 ENCOUNTER — Institutional Professional Consult (permissible substitution): Payer: Medicare Other | Admitting: Pulmonary Disease

## 2021-01-08 DIAGNOSIS — F419 Anxiety disorder, unspecified: Secondary | ICD-10-CM | POA: Insufficient documentation

## 2021-01-08 DIAGNOSIS — H65493 Other chronic nonsuppurative otitis media, bilateral: Secondary | ICD-10-CM | POA: Insufficient documentation

## 2021-01-08 DIAGNOSIS — M1990 Unspecified osteoarthritis, unspecified site: Secondary | ICD-10-CM | POA: Insufficient documentation

## 2021-01-08 DIAGNOSIS — E119 Type 2 diabetes mellitus without complications: Secondary | ICD-10-CM | POA: Insufficient documentation

## 2021-01-08 DIAGNOSIS — J324 Chronic pansinusitis: Secondary | ICD-10-CM | POA: Insufficient documentation

## 2021-01-08 DIAGNOSIS — K219 Gastro-esophageal reflux disease without esophagitis: Secondary | ICD-10-CM | POA: Insufficient documentation

## 2021-01-08 DIAGNOSIS — J449 Chronic obstructive pulmonary disease, unspecified: Secondary | ICD-10-CM | POA: Insufficient documentation

## 2021-01-08 DIAGNOSIS — Z9981 Dependence on supplemental oxygen: Secondary | ICD-10-CM | POA: Insufficient documentation

## 2021-01-08 DIAGNOSIS — G709 Myoneural disorder, unspecified: Secondary | ICD-10-CM | POA: Insufficient documentation

## 2021-01-08 DIAGNOSIS — L089 Local infection of the skin and subcutaneous tissue, unspecified: Secondary | ICD-10-CM | POA: Insufficient documentation

## 2021-01-08 DIAGNOSIS — R0602 Shortness of breath: Secondary | ICD-10-CM | POA: Insufficient documentation

## 2021-01-08 DIAGNOSIS — Z79891 Long term (current) use of opiate analgesic: Secondary | ICD-10-CM | POA: Insufficient documentation

## 2021-01-08 DIAGNOSIS — F32A Depression, unspecified: Secondary | ICD-10-CM | POA: Insufficient documentation

## 2021-01-08 DIAGNOSIS — Z973 Presence of spectacles and contact lenses: Secondary | ICD-10-CM | POA: Insufficient documentation

## 2021-01-08 DIAGNOSIS — I219 Acute myocardial infarction, unspecified: Secondary | ICD-10-CM | POA: Insufficient documentation

## 2021-01-08 DIAGNOSIS — S2020XA Contusion of thorax, unspecified, initial encounter: Secondary | ICD-10-CM | POA: Insufficient documentation

## 2021-01-08 DIAGNOSIS — G894 Chronic pain syndrome: Secondary | ICD-10-CM | POA: Insufficient documentation

## 2021-01-08 DIAGNOSIS — I10 Essential (primary) hypertension: Secondary | ICD-10-CM | POA: Insufficient documentation

## 2021-01-08 DIAGNOSIS — J312 Chronic pharyngitis: Secondary | ICD-10-CM | POA: Insufficient documentation

## 2021-01-08 DIAGNOSIS — R1310 Dysphagia, unspecified: Secondary | ICD-10-CM | POA: Insufficient documentation

## 2021-01-08 DIAGNOSIS — N401 Enlarged prostate with lower urinary tract symptoms: Secondary | ICD-10-CM | POA: Insufficient documentation

## 2021-01-08 DIAGNOSIS — K5903 Drug induced constipation: Secondary | ICD-10-CM | POA: Insufficient documentation

## 2021-01-08 DIAGNOSIS — G4730 Sleep apnea, unspecified: Secondary | ICD-10-CM | POA: Insufficient documentation

## 2021-01-08 DIAGNOSIS — Z86010 Personal history of colonic polyps: Secondary | ICD-10-CM | POA: Insufficient documentation

## 2021-01-28 DIAGNOSIS — J449 Chronic obstructive pulmonary disease, unspecified: Secondary | ICD-10-CM | POA: Diagnosis not present

## 2021-01-29 DIAGNOSIS — R0602 Shortness of breath: Secondary | ICD-10-CM | POA: Diagnosis not present

## 2021-01-29 DIAGNOSIS — Z23 Encounter for immunization: Secondary | ICD-10-CM | POA: Diagnosis not present

## 2021-01-29 DIAGNOSIS — Z79899 Other long term (current) drug therapy: Secondary | ICD-10-CM | POA: Diagnosis not present

## 2021-01-29 DIAGNOSIS — Z0000 Encounter for general adult medical examination without abnormal findings: Secondary | ICD-10-CM | POA: Diagnosis not present

## 2021-01-30 ENCOUNTER — Institutional Professional Consult (permissible substitution): Payer: Medicare Other | Admitting: Pulmonary Disease

## 2021-01-30 DIAGNOSIS — R0602 Shortness of breath: Secondary | ICD-10-CM | POA: Diagnosis not present

## 2021-01-30 NOTE — Progress Notes (Deleted)
Cardiology Office Note:    Date:  01/30/2021   ID:  [name], DOB 21-Feb-1956, MRN [id number]  PCP:  Buckner Malta, MD  Cardiologist:  Norman Herrlich, MD   Referring MD: Buckner Malta, MD  ASSESSMENT:    No diagnosis found. PLAN:    In order of problems listed above:  ***  Next appointment   Medication Adjustments/Labs and Tests Ordered: Current medicines are reviewed at length with the patient today.  Concerns regarding medicines are outlined above.  No orders of the defined types were placed in this encounter.  No orders of the defined types were placed in this encounter.    No chief complaint on file. ***  History of Present Illness:    [name] is a 65 y.o. male who is being seen today for the evaluation of *** at the request of Buckner Malta, MD.  He was seen [facility] regional cardiology 02/04/2025 I reviewed the office notes.  Relates a history of advanced COPD requiring ambulatory oxygen.  He was referred for chest pain.  His EKG was described as sinus rhythm and normal.  He did not have any cardiac diagnostic testing performed. EKG 12/12/2019 showed sinus rhythm 1 PVC otherwise normal. Past Medical History:  Diagnosis Date   Anxiety    went off medicines for anxiety 6 months ago ( 12/2010), pt. "stays nervous", per pt.    Arthritis    lumbar stenosis    Benign prostatic hyperplasia with lower urinary tract symptoms    Chronic pain syndrome    Chronic pansinusitis    Chronic pharyngitis    Compression fracture of thoracic vertebra (HCC)    T11,T12 and L1   Contusion of thorax, unspecified, initial encounter    COPD (chronic obstructive pulmonary disease) (HCC)    Cysts    groin area (on & off- for 4 +yrs.) for which he uses Doxycyline on going, BID, Rx fr. Dr. Fatima Blank in [place], [place].,     Dependence on supplemental oxygen    Depression    Diabetes mellitus [facility])    denied DM2 history 11/24/19; A1c 6.1% 02/02/18 (pre-diabetes)   Drug  induced constipation    Dysphagia    Finger infection    05/2011, L hand, index finger  - I&D in MD office, treated /w antibiotic.Marland KitchenMarland KitchenMarland Kitchen?name   GERD (gastroesophageal reflux disease)    Headache(784.0)    uses excedrin prn   History of colon polyps    History of normal resting EKG    2009, preop   Hypertension    Long term (current) use of opiate analgesic    Myocardial infarction [facility])    denied MI, but reported LVF mildly decreased in the past 11/24/19; LVEF 50-55% 2018 by echo, LVEF 48% on non-ischemic Dobutamine stress test 01/2015   Neuromuscular disorder (HCC)    pseudoarthrosis    Normal cardiac stress test    2005, done prior to back surgery   Other chronic nonsuppurative otitis media, bilateral    Shortness of breath    pt. reports related to pain    Sleep apnea    study- 2012[facility] , Dr. Blenda Nicely, [address]  , uses CPAP  qo night    Wears glasses     Past Surgical History:  Procedure Laterality Date   APPENDECTOMY     as a teen    BACK SURGERY     multiple, lumbar & cerv. fusions, last low back- 2009   COLONOSCOPY  06/06/2010  Colon polyps- status post polypectomy. Moderate predominantly sigmoid diverticulosis. Internal hemorrhoids.   ESOPHAGOGASTRODUODENOSCOPY  06/14/2008   Mild gastritis. Otherwise normal EGD.    [facility]IDECTOMY     [facility]   KYPHOPLASTY N/A 11/27/2019   Procedure: THORACIC ELEVEN, THORACIC TWELVE, LUMBAR ONE KYPHOPLASTY;  Surgeon: Barnett Abu, MD;  Location: [facility] OR;  Service: Neurosurgery;  Laterality: N/A;   NECK SURGERY      Current Medications: No outpatient medications have been marked as taking for the 01/31/21 encounter (Appointment) with Baldo Daub, MD.     Allergies:   Strawberry extract   Social History   Socioeconomic History   Marital status: Married    Spouse name: Not on file   Number of children: Not on file   Years of education: Not on file   Highest education level: Not on file   Occupational History   Not on file  Tobacco Use   Smoking status: Every Day    Packs/day: 1.00    Types: Cigarettes   Smokeless tobacco: Never  Vaping Use   Vaping Use: Never used  Substance and Sexual Activity   Alcohol use: Not Currently   Drug use: No   Sexual activity: Not on file  Other Topics Concern   Not on file  Social History Narrative   Not on file   Social Determinants of Health   Financial Resource Strain: Not on file  Food Insecurity: Not on file  Transportation Needs: Not on file  Physical Activity: Not on file  Stress: Not on file  Social Connections: Not on file     Family History: The patient's ***family history includes Alcoholism in his father and mother; Coronary artery disease in his father; Hypertension in his father. There is no history of Anesthesia problems.  ROS:   ROS Please see the history of present illness.    *** All other systems reviewed and are negative.  EKGs/Labs/Other Studies Reviewed:    The following studies were reviewed today: ***  EKG:  EKG is *** ordered today.  The ekg ordered today is personally reviewed and demonstrates ***  Recent Labs: No results found for requested labs within last 8760 hours.  Recent Lipid Panel No results found for: CHOL, TRIG, HDL, CHOLHDL, VLDL, LDLCALC, LDLDIRECT  Physical Exam:    VS:  There were no vitals taken for this visit.    Wt Readings from Last 3 Encounters:  11/27/19 (!) 250 lb (113.4 kg)  08/01/18 280 lb (127 kg)  06/02/13 235 lb (106.6 kg)     GEN: *** Well nourished, well developed in no acute distress HEENT: Normal NECK: No JVD; No carotid bruits LYMPHATICS: No lymphadenopathy CARDIAC: ***RRR, no murmurs, rubs, gallops RESPIRATORY:  Clear to auscultation without rales, wheezing or rhonchi  ABDOMEN: Soft, non-tender, non-distended MUSCULOSKELETAL:  No edema; No deformity  SKIN: Warm and dry NEUROLOGIC:  Alert and oriented x 3 PSYCHIATRIC:  Normal affect      Signed, Norman Herrlich, MD  01/30/2021 1:54 PM    [facility]

## 2021-01-31 ENCOUNTER — Ambulatory Visit: Payer: Medicare Other | Admitting: Cardiology

## 2021-02-03 DIAGNOSIS — M47816 Spondylosis without myelopathy or radiculopathy, lumbar region: Secondary | ICD-10-CM | POA: Diagnosis not present

## 2021-02-03 DIAGNOSIS — M47812 Spondylosis without myelopathy or radiculopathy, cervical region: Secondary | ICD-10-CM | POA: Diagnosis not present

## 2021-02-03 DIAGNOSIS — Z1389 Encounter for screening for other disorder: Secondary | ICD-10-CM | POA: Diagnosis not present

## 2021-02-03 DIAGNOSIS — G894 Chronic pain syndrome: Secondary | ICD-10-CM | POA: Diagnosis not present

## 2021-02-12 ENCOUNTER — Telehealth: Payer: Self-pay | Admitting: Pulmonary Disease

## 2021-02-12 NOTE — Telephone Encounter (Signed)
Patient scheduled sleep consult 02/26/21 with Dr. Wynona Neat.  Papers received via fax from [facility] stated Patient is currently on cpap. ATC Patient and LM for Patient to bring SD card to OV. I called [facility] and Patient is a current customer. Sarah faxed 2012 home sleep study to office.

## 2021-02-26 ENCOUNTER — Institutional Professional Consult (permissible substitution): Payer: Medicare Other | Admitting: Pulmonary Disease

## 2021-02-26 DIAGNOSIS — M47812 Spondylosis without myelopathy or radiculopathy, cervical region: Secondary | ICD-10-CM | POA: Diagnosis not present

## 2021-02-26 DIAGNOSIS — M47816 Spondylosis without myelopathy or radiculopathy, lumbar region: Secondary | ICD-10-CM | POA: Diagnosis not present

## 2021-02-26 DIAGNOSIS — G894 Chronic pain syndrome: Secondary | ICD-10-CM | POA: Diagnosis not present

## 2021-02-26 DIAGNOSIS — Z1389 Encounter for screening for other disorder: Secondary | ICD-10-CM | POA: Diagnosis not present

## 2021-02-28 DIAGNOSIS — J449 Chronic obstructive pulmonary disease, unspecified: Secondary | ICD-10-CM | POA: Diagnosis not present

## 2021-03-13 ENCOUNTER — Ambulatory Visit: Payer: Medicare Other | Admitting: Cardiology

## 2021-03-13 NOTE — Progress Notes (Incomplete)
Cardiology Office Note:    Date:  03/13/2021   ID:  [name], DOB 11-Aug-1955, MRN [id number]  PCP:  Buckner Malta, MD  Cardiologist:  Norman Herrlich, MD   Referring MD: Buckner Malta, MD  ASSESSMENT:    No diagnosis found. PLAN:    In order of problems listed above:  ***  Next appointment   Medication Adjustments/Labs and Tests Ordered: Current medicines are reviewed at length with the patient today.  Concerns regarding medicines are outlined above.  No orders of the defined types were placed in this encounter.  No orders of the defined types were placed in this encounter.    No chief complaint on file. ***  History of Present Illness:    [name] is a 65 y.o. male who is being seen today for the evaluation of *** at the request of Buckner Malta, MD. He had an echocardiogram [facility] October 2019 described as technically difficult left ventricle mildly dilated EF low normal 50 to 55% Chart review shows he was seen at [facility] cardiology 02/05/2015 his medical problems including COPD chest pain he has referred for myocardial perfusion test.  It showed EF of 49% and no ischemia. Past Medical History:  Diagnosis Date   Anxiety    went off medicines for anxiety 6 months ago ( 12/2010), pt. "stays nervous", per pt.    Arthritis    lumbar stenosis    Benign prostatic hyperplasia with lower urinary tract symptoms    Chronic pain syndrome    Chronic pansinusitis    Chronic pharyngitis    Compression fracture of thoracic vertebra (HCC)    T11,T12 and L1   Contusion of thorax, unspecified, initial encounter    COPD (chronic obstructive pulmonary disease) (HCC)    Cysts    groin area (on & off- for 4 +yrs.) for which he uses Doxycyline on going, BID, Rx fr. Dr. Fatima Blank in [place], [place].,     Dependence on supplemental oxygen    Depression    Diabetes mellitus [facility])    denied DM2 history 11/24/19; A1c 6.1% 02/02/18 (pre-diabetes)    Drug induced constipation    Dysphagia    Finger infection    05/2011, L hand, index finger  - I&D in MD office, treated /w antibiotic.Marland KitchenMarland KitchenMarland Kitchen?name   GERD (gastroesophageal reflux disease)    Headache(784.0)    uses excedrin prn   History of colon polyps    History of normal resting EKG    2009, preop   Hypertension    Long term (current) use of opiate analgesic    Myocardial infarction [facility])    denied MI, but reported LVF mildly decreased in the past 11/24/19; LVEF 50-55% 2018 by echo, LVEF 48% on non-ischemic Dobutamine stress test 01/2015   Neuromuscular disorder (HCC)    pseudoarthrosis    Normal cardiac stress test    2005, done prior to back surgery   Other chronic nonsuppurative otitis media, bilateral    Shortness of breath    pt. reports related to pain    Sleep apnea    study- 2012[facility] , Dr. Blenda Nicely, [address]  , uses CPAP  qo night    Wears glasses     Past Surgical History:  Procedure Laterality Date   APPENDECTOMY     as a teen    BACK SURGERY     multiple, lumbar & cerv. fusions, last low back- 2009   COLONOSCOPY  06/06/2010   Colon polyps- status post  polypectomy. Moderate predominantly sigmoid diverticulosis. Internal hemorrhoids.   ESOPHAGOGASTRODUODENOSCOPY  06/14/2008   Mild gastritis. Otherwise normal EGD.    [facility]IDECTOMY     [facility]   KYPHOPLASTY N/A 11/27/2019   Procedure: THORACIC ELEVEN, THORACIC TWELVE, LUMBAR ONE KYPHOPLASTY;  Surgeon: Barnett Abu, MD;  Location: [facility] OR;  Service: Neurosurgery;  Laterality: N/A;   NECK SURGERY      Current Medications: No outpatient medications have been marked as taking for the 03/13/21 encounter (Appointment) with Baldo Daub, MD.     Allergies:   Strawberry extract   Social History   Socioeconomic History   Marital status: Married    Spouse name: Not on file   Number of children: Not on file   Years of education: Not on file   Highest  education level: Not on file  Occupational History   Not on file  Tobacco Use   Smoking status: Every Day    Packs/day: 1.00    Types: Cigarettes   Smokeless tobacco: Never  Vaping Use   Vaping Use: Never used  Substance and Sexual Activity   Alcohol use: Not Currently   Drug use: No   Sexual activity: Not on file  Other Topics Concern   Not on file  Social History Narrative   Not on file   Social Determinants of Health   Financial Resource Strain: Not on file  Food Insecurity: Not on file  Transportation Needs: Not on file  Physical Activity: Not on file  Stress: Not on file  Social Connections: Not on file     Family History: The patient's ***family history includes Alcoholism in his father and mother; Coronary artery disease in his father; Hypertension in his father. There is no history of Anesthesia problems.  ROS:   ROS Please see the history of present illness.    *** All other systems reviewed and are negative.  EKGs/Labs/Other Studies Reviewed:    The following studies were reviewed today: ***  EKG:  EKG is *** ordered today.  The ekg ordered today is personally reviewed and demonstrates ***  Recent Labs: No results found for requested labs within last 8760 hours.  Recent Lipid Panel No results found for: CHOL, TRIG, HDL, CHOLHDL, VLDL, LDLCALC, LDLDIRECT  Physical Exam:    VS:  There were no vitals taken for this visit.    Wt Readings from Last 3 Encounters:  11/27/19 (!) 250 lb (113.4 kg)  08/01/18 280 lb (127 kg)  06/02/13 235 lb (106.6 kg)     GEN: *** Well nourished, well developed in no acute distress HEENT: Normal NECK: No JVD; No carotid bruits LYMPHATICS: No lymphadenopathy CARDIAC: ***RRR, no murmurs, rubs, gallops RESPIRATORY:  Clear to auscultation without rales, wheezing or rhonchi  ABDOMEN: Soft, non-tender, non-distended MUSCULOSKELETAL:  No edema; No deformity  SKIN: Warm and dry NEUROLOGIC:  Alert and oriented x  3 PSYCHIATRIC:  Normal affect     Signed, Norman Herrlich, MD  03/13/2021 7:26 AM    [facility]

## 2021-03-18 ENCOUNTER — Institutional Professional Consult (permissible substitution): Payer: Medicare Other | Admitting: Pulmonary Disease

## 2021-03-26 DIAGNOSIS — Z1389 Encounter for screening for other disorder: Secondary | ICD-10-CM | POA: Diagnosis not present

## 2021-03-26 DIAGNOSIS — M47812 Spondylosis without myelopathy or radiculopathy, cervical region: Secondary | ICD-10-CM | POA: Diagnosis not present

## 2021-03-26 DIAGNOSIS — M47816 Spondylosis without myelopathy or radiculopathy, lumbar region: Secondary | ICD-10-CM | POA: Diagnosis not present

## 2021-03-26 DIAGNOSIS — G894 Chronic pain syndrome: Secondary | ICD-10-CM | POA: Diagnosis not present

## 2021-03-26 DIAGNOSIS — M549 Dorsalgia, unspecified: Secondary | ICD-10-CM | POA: Diagnosis not present

## 2021-03-30 DIAGNOSIS — J449 Chronic obstructive pulmonary disease, unspecified: Secondary | ICD-10-CM | POA: Diagnosis not present

## 2021-04-15 DIAGNOSIS — G894 Chronic pain syndrome: Secondary | ICD-10-CM | POA: Diagnosis not present

## 2021-04-15 DIAGNOSIS — Z1389 Encounter for screening for other disorder: Secondary | ICD-10-CM | POA: Diagnosis not present

## 2021-04-15 DIAGNOSIS — M549 Dorsalgia, unspecified: Secondary | ICD-10-CM | POA: Diagnosis not present

## 2021-04-15 DIAGNOSIS — M47816 Spondylosis without myelopathy or radiculopathy, lumbar region: Secondary | ICD-10-CM | POA: Diagnosis not present

## 2021-04-15 DIAGNOSIS — M47812 Spondylosis without myelopathy or radiculopathy, cervical region: Secondary | ICD-10-CM | POA: Diagnosis not present

## 2021-04-15 DIAGNOSIS — Z79891 Long term (current) use of opiate analgesic: Secondary | ICD-10-CM | POA: Diagnosis not present

## 2021-04-30 DIAGNOSIS — J449 Chronic obstructive pulmonary disease, unspecified: Secondary | ICD-10-CM | POA: Diagnosis not present

## 2021-05-01 DIAGNOSIS — J441 Chronic obstructive pulmonary disease with (acute) exacerbation: Secondary | ICD-10-CM | POA: Diagnosis not present

## 2021-05-06 ENCOUNTER — Encounter: Payer: Self-pay | Admitting: Cardiology

## 2021-05-06 ENCOUNTER — Other Ambulatory Visit: Payer: Self-pay

## 2021-05-06 ENCOUNTER — Ambulatory Visit: Payer: Medicare Other | Admitting: Cardiology

## 2021-05-06 VITALS — BP 144/78 | HR 71 | Ht 67.0 in | Wt 262.4 lb

## 2021-05-06 DIAGNOSIS — I420 Dilated cardiomyopathy: Secondary | ICD-10-CM

## 2021-05-06 DIAGNOSIS — I10 Essential (primary) hypertension: Secondary | ICD-10-CM

## 2021-05-06 DIAGNOSIS — Z9889 Other specified postprocedural states: Secondary | ICD-10-CM | POA: Diagnosis not present

## 2021-05-06 DIAGNOSIS — R0609 Other forms of dyspnea: Secondary | ICD-10-CM

## 2021-05-06 DIAGNOSIS — Z9981 Dependence on supplemental oxygen: Secondary | ICD-10-CM | POA: Diagnosis not present

## 2021-05-06 DIAGNOSIS — J449 Chronic obstructive pulmonary disease, unspecified: Secondary | ICD-10-CM

## 2021-05-06 HISTORY — DX: Dilated cardiomyopathy: I42.0

## 2021-05-06 NOTE — Patient Instructions (Addendum)
Medication Instructions:  Your physician recommends that you continue on your current medications as directed. Please refer to the Current Medication list given to you today.   *If you need a refill on your cardiac medications before your next appointment, please call your pharmacy*   Lab Work: None ordered  If you have labs (blood work) drawn today and your tests are completely normal, you will receive your results only by: MyChart Message (if you have MyChart) OR A paper copy in the mail If you have any lab test that is abnormal or we need to change your treatment, we will call you to review the results.   Testing/Procedures: Your physician has requested that you have an echocardiogram. Echocardiography is a painless test that uses sound waves to create images of your heart. It provides your doctor with information about the size and shape of your heart and how well your hearts chambers and valves are working. This procedure takes approximately one hour. There are no restrictions for this procedure.    Follow-Up: At [facility], you and your health needs are our priority.  As part of our continuing mission to provide you with exceptional heart care, we have created designated Provider Care Teams.  These Care Teams include your primary Cardiologist (physician) and Advanced Practice Providers (APPs -  Physician Assistants and Nurse Practitioners) who all work together to provide you with the care you need, when you need it.  We recommend signing up for the patient portal called "MyChart".  Sign up information is provided on this After Visit Summary.  MyChart is used to connect with patients for Virtual Visits (Telemedicine).  Patients are able to view lab/test results, encounter notes, upcoming appointments, etc.  Non-urgent messages can be sent to your provider as well.   To learn more about what you can do with MyChart, go to [email or web address].    Your next appointment:   4-6  Weeks   The format for your next appointment:   In Person  Provider:   Gypsy Balsam, MD    Other Instructions

## 2021-05-06 NOTE — Progress Notes (Signed)
Cardiology Consultation:    Date:  05/06/2021   ID:  [name], DOB 1955-11-15, MRN [id number]  PCP:  Serita Grammes, MD  Cardiologist:  Jenne Campus, MD   Referring MD: Serita Grammes, MD   Chief Complaint  Patient presents with   Congestive Heart Failure    Hx of heart attack, heart valve abnormalities    History of Present Illness:    [name] is a 66 y.o. male who is being seen today for the evaluation of I had a heart attack in the past at the request of Serita Grammes, MD. past medical history significant for lifelong smoking, he still continues to smoke he does have COPD, he is on supplemental oxygen all the time.  Also essential hypertension, borderline diabetes, dyslipidemia.  He was referred to [place] because years ago he had echocardiogram done which showed diminished left ventricle ejection fraction.  He did see my colleague Dr. Joylene Grapes probably close to 8 years ago at that time stress test was done which apparently was fine.  His ability to exercise is very limited because of back pain as well as shortness of breath because of COPD.  He denies having any typical chest pain tightness squeezing pressure burning in the chest that would happen with exertion but overall he complained of having discomfort in his back with sometimes radiation towards the chest.  Sadly he still continues to smoke.  He does have family history of premature cardiac disease his father actually died in his 52s and he was not a smoker.  He does not exercise on the regular basis not on any special diet and again he still continues to smoke.  Past Medical History:  Diagnosis Date   Anxiety    went off medicines for anxiety 6 months ago ( 12/2010), pt. "stays nervous", per pt.    Arthritis    lumbar stenosis    Benign prostatic hyperplasia with lower urinary tract symptoms    Chronic pain syndrome    Chronic pansinusitis    Chronic pharyngitis    Compression fracture of thoracic  vertebra (HCC)    T11,T12 and L1   Contusion of thorax, unspecified, initial encounter    COPD (chronic obstructive pulmonary disease) (HCC)    Cysts    groin area (on & off- for 4 +yrs.) for which he uses Doxycyline on going, BID, Rx fr. Dr. Michele Mcalpine in [place], [place].,     Dependence on supplemental oxygen    Depression    Diabetes mellitus [facility])    denied DM2 history 11/24/19; A1c 6.1% 02/02/18 (pre-diabetes)   Drug induced constipation    Dysphagia    Finger infection    05/2011, L hand, index finger  - I&D in MD office, treated /w antibiotic.Marland KitchenMarland KitchenMarland Kitchen?name   GERD (gastroesophageal reflux disease)    Headache(784.0)    uses excedrin prn   History of colon polyps    History of normal resting EKG    2009, preop   Hypertension    Long term (current) use of opiate analgesic    Myocardial infarction [facility])    denied MI, but reported LVF mildly decreased in the past 11/24/19; LVEF 50-55% 2018 by echo, LVEF 48% on non-ischemic Dobutamine stress test 01/2015   Neuromuscular disorder ([place])    pseudoarthrosis    Normal cardiac stress test    2005, done prior to back surgery   Other chronic nonsuppurative otitis media, bilateral    Shortness of breath    pt.  reports related to pain    Sleep apnea    study- 2012[facility] , Dr. Alcide Clever, [address]  , uses CPAP  qo night    Wears glasses     Past Surgical History:  Procedure Laterality Date   APPENDECTOMY     as a teen    BACK SURGERY     multiple, lumbar & cerv. fusions, last low back- 2009   COLONOSCOPY  06/06/2010   Colon polyps- status post polypectomy. Moderate predominantly sigmoid diverticulosis. Internal hemorrhoids.   ESOPHAGOGASTRODUODENOSCOPY  06/14/2008   Mild gastritis. Otherwise normal EGD.    HEMORROIDECTOMY     2012[facility]   KYPHOPLASTY N/A 11/27/2019   Procedure: THORACIC ELEVEN, THORACIC TWELVE, LUMBAR ONE KYPHOPLASTY;  Surgeon: Kristeen Miss, MD;  Location: [facility];  Service: Neurosurgery;   Laterality: N/A;   NECK SURGERY      Current Medications: Current Meds  Medication Sig   albuterol (PROVENTIL) (2.5 MG/3ML) 0.083% nebulizer solution Take 2.5 mg by nebulization every 6 (six) hours as needed for wheezing or shortness of breath.   albuterol (VENTOLIN HFA) 108 (90 Base) MCG/ACT inhaler Inhale 1-2 puffs into the lungs every 6 (six) hours as needed for wheezing or shortness of breath.   Aspirin-Acetaminophen-Caffeine (GOODYS EXTRA STRENGTH) 500-325-65 MG PACK Take 1 packet by mouth daily as needed (pain).   busPIRone (BUSPAR) 30 MG tablet Take 30 mg by mouth 3 (three) times daily.   cholecalciferol (VITAMIN D3) 25 MCG (1000 UNIT) tablet Take 1,000 Units by mouth daily.   doxycycline (VIBRAMYCIN) 100 MG capsule Take 100 mg by mouth 2 (two) times daily.   DULoxetine (CYMBALTA) 60 MG capsule Take 60 mg by mouth daily.   fluticasone (FLONASE) 50 MCG/ACT nasal spray Place 1 spray into both nostrils daily as needed for allergies or rhinitis.   furosemide (LASIX) 20 MG tablet Take 20-40 mg by mouth daily.   gabapentin (NEURONTIN) 300 MG capsule Take 300 mg by mouth 3 (three) times daily.   metoprolol succinate (TOPROL-XL) 50 MG 24 hr tablet Take 50 mg by mouth daily. Take with or immediately following a meal.   oxyCODONE (ROXICODONE) 15 MG immediate release tablet Take 15 mg by mouth 4 (four) times daily as needed.   predniSONE (DELTASONE) 5 MG tablet Take 5 mg by mouth daily.   Tamsulosin HCl (FLOMAX) 0.4 MG CAPS Take 0.4 mg by mouth daily.    tiZANidine (ZANAFLEX) 4 MG capsule Take 4 mg by mouth 3 (three) times daily.   traZODone (DESYREL) 50 MG tablet Take 100 mg by mouth at bedtime. For sleep     Allergies:   Strawberry extract   Social History   Socioeconomic History   Marital status: Married    Spouse name: Not on file   Number of children: Not on file   Years of education: Not on file   Highest education level: Not on file  Occupational History   Not on file  Tobacco  Use   Smoking status: Every Day    Packs/day: 1.00    Types: Cigarettes   Smokeless tobacco: Never  Vaping Use   Vaping Use: Never used  Substance and Sexual Activity   Alcohol use: Not Currently   Drug use: No   Sexual activity: Not on file  Other Topics Concern   Not on file  Social History Narrative   Not on file   Social Determinants of Health   Financial Resource Strain: Not on file  Food Insecurity:  Not on file  Transportation Needs: Not on file  Physical Activity: Not on file  Stress: Not on file  Social Connections: Not on file     Family History: The patient's family history includes Alcoholism in his father and mother; Coronary artery disease in his father; Hypertension in his father. There is no history of Anesthesia problems. ROS:   Please see the history of present illness.    All 14 point review of systems negative except as described per history of present illness.  EKGs/Labs/Other Studies Reviewed:    The following studies were reviewed today:   EKG:  EKG is  ordered today.  The ekg ordered today demonstrates normal sinus rhythm, PVCs, normal P interval, nonspecific ST segment changes  Recent Labs: No results found for requested labs within last 8760 hours.  Recent Lipid Panel No results found for: CHOL, TRIG, HDL, CHOLHDL, VLDL, LDLCALC, LDLDIRECT  Physical Exam:    VS:  BP (!) 144/78 (BP Location: Right Arm, Patient Position: Sitting)    Pulse 71    Ht 5\' 7"  (1.702 m)    Wt 262 lb 6.4 oz (119 kg)    SpO2 95%    BMI 41.10 kg/m     Wt Readings from Last 3 Encounters:  05/06/21 262 lb 6.4 oz (119 kg)  11/27/19 (!) 250 lb (113.4 kg)  08/01/18 280 lb (127 kg)     GEN:  Well nourished, well developed in no acute distress HEENT: Normal NECK: No JVD; No carotid bruits LYMPHATICS: No lymphadenopathy CARDIAC: Tones are very distant RRR, no murmurs, no rubs, no gallops RESPIRATORY: Poor air entry bilaterally ABDOMEN: Soft, non-tender,  non-distended MUSCULOSKELETAL:  No edema; No deformity  SKIN: Warm and dry NEUROLOGIC:  Alert and oriented x 3 PSYCHIATRIC:  Normal affect   ASSESSMENT:    1. Primary hypertension   2. Dilated cardiomyopathy (HCC)   3. Chronic obstructive pulmonary disease, unspecified COPD type (HCC)   4. Dependence on supplemental oxygen   5. Status post kyphoplasty    PLAN:    In order of problems listed above:  History of cardiomyopathy.  Obviously that need to be clarified.  I will ask him to have echocardiogram done to recheck left ventricle ejection fraction future recommendation will be made based on results of this test. He does have multiple risk factors for coronary artery disease he probably will benefit from evaluation for coronary artery disease but the way we cannot do it will depend on results of his echocardiogram meaning if he does have significantly diminished left ventricle ejection fraction may be even cardiac catheterization will be warranted if not we can do either coronary CT angio or stress test.  In the meantime I will ask him to continue present medications.  I will not give him aspirin since he already take Goody powders every single day.  Please also beta-blocker which I will continue. Cholesterol status unknown to me I did review K PN which show me total cholesterol 147 and HDL 39.  I do not see LDL.  I will call primary care physician to get results of this.  He probably can benefit from cholesterol medications. Smoking obviously huge problem I spent a great of time talking to him about the fact that he need to quit.  He tried to quit one time in his life when he got for his back surgery he stay away from smoking for about a month otherwise continue to smoke   Medication Adjustments/Labs and Tests Ordered:  Current medicines are reviewed at length with the patient today.  Concerns regarding medicines are outlined above.  No orders of the defined types were placed in this  encounter.  No orders of the defined types were placed in this encounter.   Signed, Park Liter, MD, [facility]. 05/06/2021 11:22 AM     [facility]

## 2021-05-14 ENCOUNTER — Ambulatory Visit (INDEPENDENT_AMBULATORY_CARE_PROVIDER_SITE_OTHER): Payer: Medicare Other

## 2021-05-14 ENCOUNTER — Other Ambulatory Visit: Payer: Self-pay

## 2021-05-14 DIAGNOSIS — R0609 Other forms of dyspnea: Secondary | ICD-10-CM

## 2021-05-14 LAB — ECHOCARDIOGRAM COMPLETE
Area-P 1/2: 2.33 cm2
S' Lateral: 4.1 cm

## 2021-05-16 NOTE — Progress Notes (Signed)
I reached the patient and he states is doing well with meds. Message sent to Dr. Bing Matter for further instructions

## 2021-05-16 NOTE — Progress Notes (Signed)
I reached the patient and he states is doing well with meds. Message sent to Dr. Bing Matter for further instructions  05/16/21-LM

## 2021-05-19 DIAGNOSIS — G894 Chronic pain syndrome: Secondary | ICD-10-CM | POA: Diagnosis not present

## 2021-05-19 DIAGNOSIS — M47816 Spondylosis without myelopathy or radiculopathy, lumbar region: Secondary | ICD-10-CM | POA: Diagnosis not present

## 2021-05-19 DIAGNOSIS — M549 Dorsalgia, unspecified: Secondary | ICD-10-CM | POA: Diagnosis not present

## 2021-05-19 DIAGNOSIS — M47812 Spondylosis without myelopathy or radiculopathy, cervical region: Secondary | ICD-10-CM | POA: Diagnosis not present

## 2021-05-19 DIAGNOSIS — Z1389 Encounter for screening for other disorder: Secondary | ICD-10-CM | POA: Diagnosis not present

## 2021-05-19 DIAGNOSIS — Z79891 Long term (current) use of opiate analgesic: Secondary | ICD-10-CM | POA: Diagnosis not present

## 2021-05-31 DIAGNOSIS — J449 Chronic obstructive pulmonary disease, unspecified: Secondary | ICD-10-CM | POA: Diagnosis not present

## 2021-06-06 ENCOUNTER — Ambulatory Visit: Payer: Medicare Other | Admitting: Cardiology

## 2021-06-10 DIAGNOSIS — I43 Cardiomyopathy in diseases classified elsewhere: Secondary | ICD-10-CM | POA: Diagnosis not present

## 2021-06-10 DIAGNOSIS — J439 Emphysema, unspecified: Secondary | ICD-10-CM | POA: Diagnosis not present

## 2021-06-10 DIAGNOSIS — M4716 Other spondylosis with myelopathy, lumbar region: Secondary | ICD-10-CM | POA: Diagnosis not present

## 2021-06-10 DIAGNOSIS — R0609 Other forms of dyspnea: Secondary | ICD-10-CM | POA: Diagnosis not present

## 2021-06-10 DIAGNOSIS — I110 Hypertensive heart disease with heart failure: Secondary | ICD-10-CM | POA: Diagnosis not present

## 2021-06-17 DIAGNOSIS — M47812 Spondylosis without myelopathy or radiculopathy, cervical region: Secondary | ICD-10-CM | POA: Diagnosis not present

## 2021-06-17 DIAGNOSIS — M47816 Spondylosis without myelopathy or radiculopathy, lumbar region: Secondary | ICD-10-CM | POA: Diagnosis not present

## 2021-06-17 DIAGNOSIS — Z1389 Encounter for screening for other disorder: Secondary | ICD-10-CM | POA: Diagnosis not present

## 2021-06-17 DIAGNOSIS — G894 Chronic pain syndrome: Secondary | ICD-10-CM | POA: Diagnosis not present

## 2021-06-28 DIAGNOSIS — J449 Chronic obstructive pulmonary disease, unspecified: Secondary | ICD-10-CM | POA: Diagnosis not present

## 2021-07-11 DIAGNOSIS — I5189 Other ill-defined heart diseases: Secondary | ICD-10-CM | POA: Diagnosis not present

## 2021-07-11 DIAGNOSIS — J302 Other seasonal allergic rhinitis: Secondary | ICD-10-CM | POA: Diagnosis not present

## 2021-07-11 DIAGNOSIS — J439 Emphysema, unspecified: Secondary | ICD-10-CM | POA: Diagnosis not present

## 2021-07-15 DIAGNOSIS — M47816 Spondylosis without myelopathy or radiculopathy, lumbar region: Secondary | ICD-10-CM | POA: Diagnosis not present

## 2021-07-15 DIAGNOSIS — M549 Dorsalgia, unspecified: Secondary | ICD-10-CM | POA: Diagnosis not present

## 2021-07-15 DIAGNOSIS — Z1389 Encounter for screening for other disorder: Secondary | ICD-10-CM | POA: Diagnosis not present

## 2021-07-15 DIAGNOSIS — G894 Chronic pain syndrome: Secondary | ICD-10-CM | POA: Diagnosis not present

## 2021-07-15 DIAGNOSIS — Z79891 Long term (current) use of opiate analgesic: Secondary | ICD-10-CM | POA: Diagnosis not present

## 2021-07-15 DIAGNOSIS — M47812 Spondylosis without myelopathy or radiculopathy, cervical region: Secondary | ICD-10-CM | POA: Diagnosis not present

## 2021-07-29 DIAGNOSIS — J449 Chronic obstructive pulmonary disease, unspecified: Secondary | ICD-10-CM | POA: Diagnosis not present

## 2021-08-18 DIAGNOSIS — G894 Chronic pain syndrome: Secondary | ICD-10-CM | POA: Diagnosis not present

## 2021-08-18 DIAGNOSIS — M47816 Spondylosis without myelopathy or radiculopathy, lumbar region: Secondary | ICD-10-CM | POA: Diagnosis not present

## 2021-08-18 DIAGNOSIS — Z1389 Encounter for screening for other disorder: Secondary | ICD-10-CM | POA: Diagnosis not present

## 2021-08-18 DIAGNOSIS — M549 Dorsalgia, unspecified: Secondary | ICD-10-CM | POA: Diagnosis not present

## 2021-08-28 DIAGNOSIS — J449 Chronic obstructive pulmonary disease, unspecified: Secondary | ICD-10-CM | POA: Diagnosis not present

## 2021-09-03 ENCOUNTER — Ambulatory Visit: Payer: Medicare Other | Admitting: Cardiology

## 2021-09-03 ENCOUNTER — Telehealth: Payer: Self-pay | Admitting: *Deleted

## 2021-09-03 ENCOUNTER — Encounter: Payer: Self-pay | Admitting: Cardiology

## 2021-09-03 VITALS — BP 156/92 | HR 65 | Ht 67.0 in | Wt 262.0 lb

## 2021-09-03 DIAGNOSIS — J432 Centrilobular emphysema: Secondary | ICD-10-CM | POA: Diagnosis not present

## 2021-09-03 DIAGNOSIS — Z9981 Dependence on supplemental oxygen: Secondary | ICD-10-CM | POA: Diagnosis not present

## 2021-09-03 DIAGNOSIS — Z6841 Body Mass Index (BMI) 40.0 and over, adult: Secondary | ICD-10-CM

## 2021-09-03 DIAGNOSIS — G4733 Obstructive sleep apnea (adult) (pediatric): Secondary | ICD-10-CM

## 2021-09-03 DIAGNOSIS — I10 Essential (primary) hypertension: Secondary | ICD-10-CM | POA: Diagnosis not present

## 2021-09-03 DIAGNOSIS — R0602 Shortness of breath: Secondary | ICD-10-CM | POA: Diagnosis not present

## 2021-09-03 NOTE — Telephone Encounter (Signed)
Left message on voicemail per DPR in reference to upcoming appointment scheduled on 09/09/21 at 1115 with detailed instructions given per Myocardial Perfusion Study Information Sheet for the test. LM to arrive 15 minutes early, and that it is imperative to arrive on time for appointment to keep from having the test rescheduled. If you need to cancel or reschedule your appointment, please call the office within 24 hours of your appointment. Failure to do so may result in a cancellation of your appointment, and a $50 no show fee. Phone number given for call back for any questions.  ?[name], [name] ? ?

## 2021-09-03 NOTE — Progress Notes (Signed)
?Cardiology Office Note:   ? ?Date:  09/03/2021  ? ?ID:  [name], DOB 1955-09-13, MRN [id number] ? ?PCP:  Serita Grammes, MD  ?Cardiologist:  Jenne Campus, MD   ? ?Referring MD: Serita Grammes, MD  ? ?Chief Complaint  ?Patient presents with  ? Follow-up  ?Still short of breath ? ?History of Present Illness:   ? ?[name] is a 66 y.o. male with past medical history significant for lifelong smoking sadly still continues to smoke, advanced COPD, continues oxygen, essential hypertension, borderline diabetes, dyslipidemia, he was referred to [place] because of 20 years ago he had echocardiogram done showing diminished ejection fraction.  I did repeat his ultrasounds of the heart which showed preserved left ventricle ejection fraction.  He complained of having some dyspnea on exertion as well as some tightness in the chest.  Of course he got multiple risk factors for coronary artery disease majority of those are not well managed when remained that he still continues to smoke which is hard to really treat.  He is on oxygen all the time. ? ?Past Medical History:  ?Diagnosis Date  ? Anxiety   ? went off medicines for anxiety 6 months ago ( 12/2010), pt. "stays nervous", per pt.   ? Arthritis   ? lumbar stenosis   ? Benign prostatic hyperplasia with lower urinary tract symptoms   ? Chronic pain syndrome   ? Chronic pansinusitis   ? Chronic pharyngitis   ? Compression fracture of thoracic vertebra (HCC)   ? T11,T12 and L1  ? Contusion of thorax, unspecified, initial encounter   ? COPD (chronic obstructive pulmonary disease) ([place])   ? Cysts   ? groin area (on & off- for 4 +yrs.) for which he uses Doxycyline on going, BID, Rx fr. Dr. Michele Mcalpine in [place], [place].,    ? Dependence on supplemental oxygen   ? Depression   ? Diabetes mellitus ([place])   ? denied DM2 history 11/24/19; A1c 6.1% 02/02/18 (pre-diabetes)  ? Drug induced constipation   ? Dysphagia   ? Finger infection   ? 05/2011, L hand, index finger  - I&D in MD  office, treated /w antibiotic.Marland KitchenMarland KitchenMarland Kitchen?name  ? GERD (gastroesophageal reflux disease)   ? Headache(784.0)   ? uses excedrin prn  ? History of colon polyps   ? History of normal resting EKG   ? 2009, preop  ? Hypertension   ? Long term (current) use of opiate analgesic   ? Myocardial infarction [facility])   ? denied MI, but reported LVF mildly decreased in the past 11/24/19; LVEF 50-55% 2018 by echo, LVEF 48% on non-ischemic Dobutamine stress test 01/2015  ? Neuromuscular disorder ([place])   ? pseudoarthrosis   ? Normal cardiac stress test   ? 2005, done prior to back surgery  ? Other chronic nonsuppurative otitis media, bilateral   ? Shortness of breath   ? pt. reports related to pain   ? Sleep apnea   ? study- 2012[facility] , Dr. Alcide Clever, [address]  , uses CPAP  qo night   ? Wears glasses   ? ? ?Past Surgical History:  ?Procedure Laterality Date  ? APPENDECTOMY    ? as a teen   ? BACK SURGERY    ? multiple, lumbar & cerv. fusions, last low back- 2009  ? COLONOSCOPY  06/06/2010  ? Colon polyps- status post polypectomy. Moderate predominantly sigmoid diverticulosis. Internal hemorrhoids.  ? ESOPHAGOGASTRODUODENOSCOPY  06/14/2008  ? Mild gastritis. Otherwise normal EGD.   ?  HEMORROIDECTOMY    ? 2012[facility]  ? KYPHOPLASTY N/A 11/27/2019  ? Procedure: THORACIC ELEVEN, THORACIC TWELVE, LUMBAR ONE KYPHOPLASTY;  Surgeon: Kristeen Miss, MD;  Location: [facility];  Service: Neurosurgery;  Laterality: N/A;  ? NECK SURGERY    ? ? ?Current Medications: ?Current Meds  ?Medication Sig  ? albuterol (PROVENTIL) (2.5 MG/3ML) 0.083% nebulizer solution Take 2.5 mg by nebulization every 6 (six) hours as needed for wheezing or shortness of breath.  ? albuterol (VENTOLIN HFA) 108 (90 Base) MCG/ACT inhaler Inhale 1-2 puffs into the lungs every 6 (six) hours as needed for wheezing or shortness of breath.  ? Aspirin-Acetaminophen-Caffeine (GOODYS EXTRA STRENGTH) 500-325-65 MG PACK Take 1 packet by mouth daily as needed (pain).  ?  busPIRone (BUSPAR) 30 MG tablet Take 30 mg by mouth 3 (three) times daily.  ? cholecalciferol (VITAMIN D3) 25 MCG (1000 UNIT) tablet Take 1,000 Units by mouth daily.  ? doxycycline (VIBRAMYCIN) 100 MG capsule Take 100 mg by mouth 2 (two) times daily.  ? DULoxetine (CYMBALTA) 60 MG capsule Take 60 mg by mouth daily.  ? fluticasone (FLONASE) 50 MCG/ACT nasal spray Place 1 spray into both nostrils daily as needed for allergies or rhinitis.  ? furosemide (LASIX) 20 MG tablet Take 20-40 mg by mouth daily.  ? gabapentin (NEURONTIN) 300 MG capsule Take 300 mg by mouth 3 (three) times daily.  ? metoprolol succinate (TOPROL-XL) 50 MG 24 hr tablet Take 50 mg by mouth daily. Take with or immediately following a meal.  ? oxyCODONE (ROXICODONE) 15 MG immediate release tablet Take 15 mg by mouth 4 (four) times daily as needed for pain.  ? predniSONE (DELTASONE) 5 MG tablet Take 5 mg by mouth daily.  ? Tamsulosin HCl (FLOMAX) 0.4 MG CAPS Take 0.4 mg by mouth daily.   ? tiZANidine (ZANAFLEX) 4 MG capsule Take 4 mg by mouth 3 (three) times daily.  ? traZODone (DESYREL) 50 MG tablet Take 100 mg by mouth at bedtime. For sleep  ?  ? ?Allergies:   Strawberry extract  ? ?Social History  ? ?Socioeconomic History  ? Marital status: Married  ?  Spouse name: Not on file  ? Number of children: Not on file  ? Years of education: Not on file  ? Highest education level: Not on file  ?Occupational History  ? Not on file  ?Tobacco Use  ? Smoking status: Every Day  ?  Packs/day: 1.00  ?  Types: Cigarettes  ? Smokeless tobacco: Never  ?Vaping Use  ? Vaping Use: Never used  ?Substance and Sexual Activity  ? Alcohol use: Not Currently  ? Drug use: No  ? Sexual activity: Not on file  ?Other Topics Concern  ? Not on file  ?Social History Narrative  ? Not on file  ? ?Social Determinants of Health  ? ?Financial Resource Strain: Not on file  ?Food Insecurity: Not on file  ?Transportation Needs: Not on file  ?Physical Activity: Not on file  ?Stress: Not on  file  ?Social Connections: Not on file  ?  ? ?Family History: ?The patient's family history includes Alcoholism in his father and mother; Coronary artery disease in his father; Hypertension in his father. There is no history of Anesthesia problems. ?ROS:   ?Please see the history of present illness.    ?All 14 point review of systems negative except as described per history of present illness ? ?EKGs/Labs/Other Studies Reviewed:   ? ? ? ?Recent Labs: ?No results found for  requested labs within last 8760 hours.  ?Recent Lipid Panel ?No results found for: CHOL, TRIG, HDL, CHOLHDL, VLDL, LDLCALC, LDLDIRECT ? ?Physical Exam:   ? ?VS:  BP (!) 156/92 (BP Location: Left Arm, Patient Position: Sitting)   Pulse 65   Ht 5\' 7"  (1.702 m)   Wt 262 lb (118.8 kg)   SpO2 93%   BMI 41.04 kg/m?    ? ?Wt Readings from Last 3 Encounters:  ?09/03/21 262 lb (118.8 kg)  ?05/06/21 262 lb 6.4 oz (119 kg)  ?11/27/19 (!) 250 lb (113.4 kg)  ?  ? ?GEN:  Well nourished, well developed in no acute distress ?HEENT: Normal ?NECK: No JVD; No carotid bruits ?LYMPHATICS: No lymphadenopathy ?CARDIAC: RRR, no murmurs, no rubs, no gallops ?RESPIRATORY: Poor entry bilaterally, bilateral wheezes ?ABDOMEN: Soft, non-tender, non-distended ?MUSCULOSKELETAL:  No edema; No deformity  ?SKIN: Warm and dry ?LOWER EXTREMITIES: no swelling ?NEUROLOGIC:  Alert and oriented x 3 ?PSYCHIATRIC:  Normal affect  ? ?ASSESSMENT:   ? ?1. Centrilobular emphysema ([place])   ?2. Obstructive sleep apnea syndrome   ?3. Primary hypertension   ?4. Body mass index (BMI) 40.0-44.9, adult (HCC)   ?5. Shortness of breath   ?6. Dependence on supplemental oxygen   ? ?PLAN:   ? ?In order of problems listed above: ? ?History of cardiomyopathy however now echocardiogram showed preserved left ventricle ejection fraction.  We will continue present management. ?History of coronary artery disease apparently before.  I will schedule him to have a stress test.  We will do a dobutamine  Cardiolite.  He is already taking Goody powder all the time so there is a lot of aspirin in it. ?Dyslipidemia, I did review his K PN which only his total cholesterol 147 HDL 39 continue present management. ?Emphysema obv

## 2021-09-03 NOTE — Patient Instructions (Signed)
Medication Instructions:  ?Your physician recommends that you continue on your current medications as directed. Please refer to the Current Medication list given to you today. ? ?*If you need a refill on your cardiac medications before your next appointment, please call your pharmacy* ? ? ?Lab Work: ?None ?If you have labs (blood work) drawn today and your tests are completely normal, you will receive your results only by: ?MyChart Message (if you have MyChart) OR ?A paper copy in the mail ?If you have any lab test that is abnormal or we need to change your treatment, we will call you to review the results. ? ? ?Testing/Procedures: ? ? ?[facility] ?[address] ?[place], [place] [address] ?Phone:  [phone] ? ? ? ?Please arrive 15 minutes prior to your appointment time for registration and insurance purposes. ? ?The test will take approximately 3 to 4 hours to complete; you may bring reading material.  If someone comes with you to your appointment, they will need to remain in the main lobby due to limited space in the testing area. **If you are pregnant or breastfeeding, please notify the nuclear lab prior to your appointment** ? ?How to prepare for your Myocardial Perfusion Test: ?Do not eat or drink 3 hours prior to your test, except you may have water. ?Do not consume products containing caffeine (regular or decaffeinated) 12 hours prior to your test. (ex: coffee, chocolate, sodas, tea). ?Do bring a list of your current medications with you.  If not listed below, you may take your medications as normal. ?Do wear comfortable clothes (no dresses or overalls) and walking shoes, tennis shoes preferred (No heels or open toe shoes are allowed). ?Do NOT wear cologne, perfume, aftershave, or lotions (deodorant is allowed). ?If these instructions are not followed, your test will have to be rescheduled. ? ?Please report to [address] for your test.  If you have questions or  concerns about your appointment, you can call the [facility] at [phone]. ? ?If you cannot keep your appointment, please provide 24 hours notification to the Nuclear Lab, to avoid a possible $50 charge to your account. ? ? ? ?Follow-Up: ?At [facility], you and your health needs are our priority.  As part of our continuing mission to provide you with exceptional heart care, we have created designated Provider Care Teams.  These Care Teams include your primary Cardiologist (physician) and Advanced Practice Providers (APPs -  Physician Assistants and Nurse Practitioners) who all work together to provide you with the care you need, when you need it. ? ?We recommend signing up for the patient portal called "MyChart".  Sign up information is provided on this After Visit Summary.  MyChart is used to connect with patients for Virtual Visits (Telemedicine).  Patients are able to view lab/test results, encounter notes, upcoming appointments, etc.  Non-urgent messages can be sent to your provider as well.   ?To learn more about what you can do with MyChart, go to [email or web address].   ? ?Your next appointment:   ?5 month(s) ? ?The format for your next appointment:   ?In Person ? ?Provider:   ?Gypsy Balsam, MD  ? ? ?Other Instructions ?None ? ?Important Information About Sugar ? ? ? ? ? ? ?

## 2021-09-03 NOTE — Addendum Note (Signed)
Addended by: Roxanne Mins I on: 09/03/2021 10:53 AM ? ? Modules accepted: Orders ? ?

## 2021-09-16 ENCOUNTER — Telehealth (HOSPITAL_COMMUNITY): Payer: Self-pay | Admitting: *Deleted

## 2021-09-16 NOTE — Telephone Encounter (Signed)
Left message on voicemail per DPR in reference to upcoming appointment scheduled on  09/23/21 with detailed instructions given per Myocardial Perfusion Study Information Sheet for the test. LM to arrive 15 minutes early, and that it is imperative to arrive on time for appointment to keep from having the test rescheduled. If you need to cancel or reschedule your appointment, please call the office within 24 hours of your appointment. Failure to do so may result in a cancellation of your appointment, and a $50 no show fee. Phone number given for call back for any questions.  [name]

## 2021-09-19 NOTE — Addendum Note (Signed)
Addended by: Gypsy Balsam on: 09/19/2021 07:27 AM   Modules accepted: Orders

## 2021-09-23 ENCOUNTER — Ambulatory Visit: Payer: Medicare Other

## 2021-09-23 DIAGNOSIS — M47812 Spondylosis without myelopathy or radiculopathy, cervical region: Secondary | ICD-10-CM | POA: Diagnosis not present

## 2021-09-23 DIAGNOSIS — Z1389 Encounter for screening for other disorder: Secondary | ICD-10-CM | POA: Diagnosis not present

## 2021-09-23 DIAGNOSIS — G894 Chronic pain syndrome: Secondary | ICD-10-CM | POA: Diagnosis not present

## 2021-09-23 DIAGNOSIS — M47816 Spondylosis without myelopathy or radiculopathy, lumbar region: Secondary | ICD-10-CM | POA: Diagnosis not present

## 2021-09-24 ENCOUNTER — Ambulatory Visit: Payer: Medicare Other

## 2021-09-28 DIAGNOSIS — J449 Chronic obstructive pulmonary disease, unspecified: Secondary | ICD-10-CM | POA: Diagnosis not present

## 2021-10-01 ENCOUNTER — Ambulatory Visit: Payer: Medicare Other

## 2021-10-02 ENCOUNTER — Ambulatory Visit: Payer: Medicare Other

## 2021-10-06 ENCOUNTER — Telehealth: Payer: Self-pay

## 2021-10-06 ENCOUNTER — Telehealth: Payer: Self-pay | Admitting: Cardiology

## 2021-10-06 NOTE — Telephone Encounter (Signed)
Spoke with pt regarding message about his blood pressures. Dr. Dulce Sellar addressed his message and advised him to see his PCP this week to evaluate his BP. Encouraged pt to take his readings and his medications with him to his appt. Pt agreed and verbalized understanding. He had no further questions.

## 2021-10-06 NOTE — Telephone Encounter (Signed)
Pt c/o BP issue: STAT if pt c/o blurred vision, one-sided weakness or slurred speech  1. What are your last 5 BP readings?   190/107 165/110 165/109 171/101 (about 1 hour ago) 155/97 (about 30 minutes ago)  2. Are you having any other symptoms (ex. Dizziness, headache, blurred vision, passed out)?  Fatigue   3. What is your BP issue?    Patient states for the past few weeks his BP has been elevated, but he takes his BP medication as directed, so he is unsure what to do. He states stress test scheduler advised him to contact Dr. Bing Matter for advisement prior to scheduling.

## 2021-10-13 DIAGNOSIS — I110 Hypertensive heart disease with heart failure: Secondary | ICD-10-CM | POA: Diagnosis not present

## 2021-10-13 DIAGNOSIS — I43 Cardiomyopathy in diseases classified elsewhere: Secondary | ICD-10-CM | POA: Diagnosis not present

## 2021-10-13 DIAGNOSIS — R7303 Prediabetes: Secondary | ICD-10-CM | POA: Diagnosis not present

## 2021-10-13 DIAGNOSIS — J439 Emphysema, unspecified: Secondary | ICD-10-CM | POA: Diagnosis not present

## 2021-10-22 ENCOUNTER — Telehealth: Payer: Self-pay | Admitting: *Deleted

## 2021-10-22 DIAGNOSIS — M549 Dorsalgia, unspecified: Secondary | ICD-10-CM | POA: Diagnosis not present

## 2021-10-22 DIAGNOSIS — G894 Chronic pain syndrome: Secondary | ICD-10-CM | POA: Diagnosis not present

## 2021-10-22 DIAGNOSIS — Z1389 Encounter for screening for other disorder: Secondary | ICD-10-CM | POA: Diagnosis not present

## 2021-10-22 DIAGNOSIS — Z79891 Long term (current) use of opiate analgesic: Secondary | ICD-10-CM | POA: Diagnosis not present

## 2021-10-22 NOTE — Telephone Encounter (Signed)
Left message on voicemail per DPR in reference to upcoming appointment scheduled on 10/29/21 at 1130 with detailed instructions given per Myocardial Perfusion Study Information Sheet for the test. LM to arrive 15 minutes early, and that it is imperative to arrive on time for appointment to keep from having the test rescheduled. If you need to cancel or reschedule your appointment, please call the office within 24 hours of your appointment. Failure to do so may result in a cancellation of your appointment, and a $50 no show fee. Phone number given for call back for any questions. Renee Beale, Adelene Idler

## 2021-10-28 DIAGNOSIS — J449 Chronic obstructive pulmonary disease, unspecified: Secondary | ICD-10-CM | POA: Diagnosis not present

## 2021-10-29 ENCOUNTER — Ambulatory Visit (INDEPENDENT_AMBULATORY_CARE_PROVIDER_SITE_OTHER): Payer: Medicare Other

## 2021-10-29 DIAGNOSIS — Z9981 Dependence on supplemental oxygen: Secondary | ICD-10-CM

## 2021-10-29 DIAGNOSIS — Z6841 Body Mass Index (BMI) 40.0 and over, adult: Secondary | ICD-10-CM

## 2021-10-29 DIAGNOSIS — G4733 Obstructive sleep apnea (adult) (pediatric): Secondary | ICD-10-CM

## 2021-10-29 DIAGNOSIS — J432 Centrilobular emphysema: Secondary | ICD-10-CM

## 2021-10-29 DIAGNOSIS — I10 Essential (primary) hypertension: Secondary | ICD-10-CM

## 2021-10-29 DIAGNOSIS — R0602 Shortness of breath: Secondary | ICD-10-CM | POA: Diagnosis not present

## 2021-10-29 MED ORDER — SODIUM CHLORIDE 0.9 % IV SOLN
10.0000 ug/kg/min | INTRAVENOUS | Status: AC
  Administered 2021-10-29: 40 ug/kg/min via INTRAVENOUS

## 2021-10-29 MED ORDER — TECHNETIUM TC 99M TETROFOSMIN IV KIT
29.1000 | PACK | Freq: Once | INTRAVENOUS | Status: AC | PRN
  Administered 2021-10-29: 29.1 via INTRAVENOUS

## 2021-10-30 ENCOUNTER — Ambulatory Visit: Payer: Medicare Other

## 2021-10-30 LAB — MYOCARDIAL PERFUSION IMAGING
LV dias vol: 146 mL (ref 62–150)
LV sys vol: 86 mL
MPHR: 154 {beats}/min
Nuc Stress EF: 41 %
Peak HR: 134 {beats}/min
Percent HR: 57 %
Rest HR: 65 {beats}/min
Rest Nuclear Isotope Dose: 30.2 mCi
SDS: 3
SRS: 1
SSS: 4
Stress Nuclear Isotope Dose: 29.1 mCi
TID: 0.96

## 2021-10-30 MED ORDER — TECHNETIUM TC 99M TETROFOSMIN IV KIT
30.2000 | PACK | Freq: Once | INTRAVENOUS | Status: AC | PRN
  Administered 2021-10-30: 30.2 via INTRAVENOUS

## 2021-11-07 ENCOUNTER — Ambulatory Visit: Payer: Medicare Other | Admitting: Cardiology

## 2021-11-07 ENCOUNTER — Encounter: Payer: Self-pay | Admitting: Cardiology

## 2021-11-07 VITALS — BP 168/90 | HR 65 | Ht 67.0 in | Wt 268.6 lb

## 2021-11-07 DIAGNOSIS — R9439 Abnormal result of other cardiovascular function study: Secondary | ICD-10-CM | POA: Diagnosis not present

## 2021-11-07 DIAGNOSIS — I420 Dilated cardiomyopathy: Secondary | ICD-10-CM

## 2021-11-07 DIAGNOSIS — K219 Gastro-esophageal reflux disease without esophagitis: Secondary | ICD-10-CM | POA: Diagnosis not present

## 2021-11-07 DIAGNOSIS — I2510 Atherosclerotic heart disease of native coronary artery without angina pectoris: Secondary | ICD-10-CM | POA: Insufficient documentation

## 2021-11-07 DIAGNOSIS — I25118 Atherosclerotic heart disease of native coronary artery with other forms of angina pectoris: Secondary | ICD-10-CM | POA: Diagnosis not present

## 2021-11-07 HISTORY — DX: Atherosclerotic heart disease of native coronary artery without angina pectoris: I25.10

## 2021-11-07 LAB — COMPREHENSIVE METABOLIC PANEL
ALT: 7 IU/L (ref 0–44)
AST: 9 IU/L (ref 0–40)
Albumin/Globulin Ratio: 1.2 (ref 1.2–2.2)
Albumin: 4.1 g/dL (ref 3.9–4.9)
Alkaline Phosphatase: 105 IU/L (ref 44–121)
BUN/Creatinine Ratio: 10 (ref 10–24)
BUN: 10 mg/dL (ref 8–27)
Bilirubin Total: 0.2 mg/dL (ref 0.0–1.2)
CO2: 28 mmol/L (ref 20–29)
Calcium: 9.6 mg/dL (ref 8.6–10.2)
Chloride: 98 mmol/L (ref 96–106)
Creatinine, Ser: 1 mg/dL (ref 0.76–1.27)
Globulin, Total: 3.5 g/dL (ref 1.5–4.5)
Glucose: 100 mg/dL — ABNORMAL HIGH (ref 70–99)
Potassium: 5.1 mmol/L (ref 3.5–5.2)
Sodium: 137 mmol/L (ref 134–144)
Total Protein: 7.6 g/dL (ref 6.0–8.5)
eGFR: 83 mL/min/{1.73_m2} (ref >59)

## 2021-11-07 MED ORDER — ATORVASTATIN CALCIUM 40 MG PO TABS: 40.0000 mg | ORAL_TABLET | Freq: Every day | ORAL | 3 refills | Status: DC

## 2021-11-07 MED ORDER — ISOSORBIDE MONONITRATE ER 30 MG PO TB24: 30.0000 mg | ORAL_TABLET | Freq: Every day | ORAL | 3 refills | Status: DC

## 2021-11-07 NOTE — Progress Notes (Signed)
Cardiology Office Note:    Date:  11/07/2021   ID:  [name], DOB [birth year], MRN [id number]  PCP:  Buckner Malta, MD  Cardiologist:  Gypsy Balsam, MD    Referring MD: Buckner Malta, MD   Chief Complaint  Patient presents with   Results    History of Present Illness:    [name] is a 66 y.o. male with past medical history significant for COPD which is advanced, he is oxygen all the time, lifelong smoking which he still continues, essential hypertension, borderline diabetes, dyslipidemia he was referred to [place] because apparently 20 years ago he had echocardiogram showing diminished ejection fraction and some segmental motion abnormalities.  Echocardiogram however has been repeated did not show any abnormality with preserved ejection fraction.  Because of symptomatology namely shortness of breath as well as some chest tightness he did have a stress test done stress to show ischemia involving inferior wall.  I brought him back to my office today to talk about options for this situation.  We discussed option of medical therapy versus cardiac catheterization.  When we discussed this option he said that he started having pain more frequently in the matter-of-fact he woke up last night with chest pain that lasted for about 20 minutes.  He does not do much he lives a fairly sedentary lifestyle.  Usually chest pain happen at rest.  He does have history of GERD and he cannot distinguish if the pain that he describes is because of GERD or because of some heart issues.  Past Medical History:  Diagnosis Date   Anxiety    went off medicines for anxiety 6 months ago ( 12/2010), pt. "stays nervous", per pt.    Arthritis    lumbar stenosis    Benign prostatic hyperplasia with lower urinary tract symptoms    Chronic pain syndrome    Chronic pansinusitis    Chronic pharyngitis    Compression fracture of thoracic vertebra (HCC)    T11,T12 and L1   Contusion of thorax, unspecified,  initial encounter    COPD (chronic obstructive pulmonary disease) (HCC)    Cysts    groin area (on & off- for 4 +yrs.) for which he uses Doxycyline on going, BID, Rx fr. Dr. Fatima Blank in [place], [place].,     Dependence on supplemental oxygen    Depression    Diabetes mellitus [facility])    denied DM2 history 11/24/19; A1c 6.1% 02/02/18 (pre-diabetes)   Drug induced constipation    Dysphagia    Finger infection    05/2011, L hand, index finger  - I&D in MD office, treated /w antibiotic.Marland KitchenMarland KitchenMarland Kitchen?name   GERD (gastroesophageal reflux disease)    Headache(784.0)    uses excedrin prn   History of colon polyps    History of normal resting EKG    2009, preop   Hypertension    Long term (current) use of opiate analgesic    Myocardial infarction [facility])    denied MI, but reported LVF mildly decreased in the past 11/24/19; LVEF 50-55% 2018 by echo, LVEF 48% on non-ischemic Dobutamine stress test 01/2015   Neuromuscular disorder (HCC)    pseudoarthrosis    Normal cardiac stress test    2005, done prior to back surgery   Other chronic nonsuppurative otitis media, bilateral    Shortness of breath    pt. reports related to pain    Sleep apnea    study- [facility] , Dr. Blenda Nicely, [address]  , uses  CPAP  qo night    Wears glasses     Past Surgical History:  Procedure Laterality Date   APPENDECTOMY     as a teen    BACK SURGERY     multiple, lumbar & cerv. fusions, last low back- 2009   COLONOSCOPY  06/06/2010   Colon polyps- status post polypectomy. Moderate predominantly sigmoid diverticulosis. Internal hemorrhoids.   ESOPHAGOGASTRODUODENOSCOPY  06/14/2008   Mild gastritis. Otherwise normal EGD.    HEMORROIDECTOMY     2012- [facility]   KYPHOPLASTY N/A 11/27/2019   Procedure: THORACIC ELEVEN, THORACIC TWELVE, LUMBAR ONE KYPHOPLASTY;  Surgeon: Elsner, Henry, MD;  Location: [facility] OR;  Service: Neurosurgery;  Laterality: N/A;   NECK SURGERY      Current Medications: Current Meds   Medication Sig   albuterol (PROVENTIL) (2.5 MG/3ML) 0.083% nebulizer solution Take 2.5 mg by nebulization every 6 (six) hours as needed for wheezing or shortness of breath.   albuterol (VENTOLIN HFA) 108 (90 Base) MCG/ACT inhaler Inhale 1-2 puffs into the lungs every 6 (six) hours as needed for wheezing or shortness of breath.   Aspirin-Acetaminophen-Caffeine (GOODYS EXTRA STRENGTH) 500-325-65 MG PACK Take 1 packet by mouth daily as needed (pain).   busPIRone (BUSPAR) 30 MG tablet Take 30 mg by mouth 3 (three) times daily.   DULoxetine (CYMBALTA) 60 MG capsule Take 60 mg by mouth daily.   fluticasone (FLONASE) 50 MCG/ACT nasal spray Place 1 spray into both nostrils daily as needed for allergies or rhinitis.   furosemide (LASIX) 20 MG tablet Take 20-40 mg by mouth daily.   gabapentin (NEURONTIN) 300 MG capsule Take 300 mg by mouth 3 (three) times daily.   metoprolol succinate (TOPROL-XL) 50 MG 24 hr tablet Take 50 mg by mouth daily. Take with or immediately following a meal.   oxyCODONE (ROXICODONE) 15 MG immediate release tablet Take 15 mg by mouth 4 (four) times daily as needed for pain.   predniSONE (DELTASONE) 5 MG tablet Take 5 mg by mouth daily.   Tamsulosin HCl (FLOMAX) 0.4 MG CAPS Take 0.4 mg by mouth daily.    tiZANidine (ZANAFLEX) 4 MG capsule Take 4 mg by mouth 3 (three) times daily.   traZODone (DESYREL) 50 MG tablet Take 100 mg by mouth at bedtime. For sleep   [DISCONTINUED] cholecalciferol (VITAMIN D3) 25 MCG (1000 UNIT) tablet Take 1,000 Units by mouth daily.   [DISCONTINUED] doxycycline (VIBRAMYCIN) 100 MG capsule Take 100 mg by mouth 2 (two) times daily.     Allergies:   Strawberry extract   Social History   Socioeconomic History   Marital status: Married    Spouse name: Not on file   Number of children: Not on file   Years of education: Not on file   Highest education level: Not on file  Occupational History   Not on file  Tobacco Use   Smoking status: Every Day     Packs/day: 1.00    Types: Cigarettes   Smokeless tobacco: Never  Vaping Use   Vaping Use: Never used  Substance and Sexual Activity   Alcohol use: Not Currently   Drug use: No   Sexual activity: Not on file  Other Topics Concern   Not on file  Social History Narrative   Not on file   Social Determinants of Health   Financial Resource Strain: Not on file  Food Insecurity: Not on file  Transportation Needs: Not on file  Physical Activity: Not on file  Stress: Not on file    Social Connections: Not on file     Family History: The patient's family history includes Alcoholism in his father and mother; Coronary artery disease in his father; Hypertension in his father. There is no history of Anesthesia problems. ROS:   Please see the history of present illness.    All 14 point review of systems negative except as described per history of present illness  EKGs/Labs/Other Studies Reviewed:      Recent Labs: No results found for requested labs within last 365 days.  Recent Lipid Panel No results found for: "CHOL", "TRIG", "HDL", "CHOLHDL", "VLDL", "LDLCALC", "LDLDIRECT"  Physical Exam:    VS:  BP (!) 168/90 (BP Location: Left Arm, Patient Position: Sitting)   Pulse 65   Ht 5\' 7"  (1.702 m)   Wt 268 lb 9.6 oz (121.8 kg)   SpO2 93%   BMI 42.07 kg/m     Wt Readings from Last 3 Encounters:  11/07/21 268 lb 9.6 oz (121.8 kg)  10/29/21 262 lb (118.8 kg)  09/03/21 262 lb (118.8 kg)     GEN:  Well nourished, well developed in no acute distress HEENT: Normal NECK: No JVD; No carotid bruits LYMPHATICS: No lymphadenopathy CARDIAC: RRR, no murmurs, no rubs, no gallops RESPIRATORY: Poor air entry with bilateral wheezes ABDOMEN: Soft, non-tender, non-distended MUSCULOSKELETAL:  No edema; No deformity  SKIN: Warm and dry LOWER EXTREMITIES: no swelling NEUROLOGIC:  Alert and oriented x 3 PSYCHIATRIC:  Normal affect   ASSESSMENT:    1. Dilated cardiomyopathy (HCC)   2.  Coronary artery disease of native artery of native heart with stable angina pectoris (HCC)   3. Gastroesophageal reflux disease, unspecified whether esophagitis present   4. Abnormal stress test inferior wall ischemia    PLAN:    In order of problems listed above:  Coronary artery disease, stress test showing inferior wall ischemia.  He is taking a lot of Goody powder therefore he took sent off aspirin.  I will put him isosorbide mononitrate.  He is already on beta-blocker which I will continue.  I will also initiate statin therapy. Dilated cardiomyopathy last echocardiogram showed preserved left ventricular ejection fraction. Gastroesophageal reflux disease.  The truth is that his symptomatology could be related to his GI tract however with multiple risk factors for coronary artery disease and abnormal stress test he we must rule out obstructive disease and cardiac catheterization will be done Abnormal stress test: I have a long discussion with him describe risk benefits alternatives of cardiac catheterization he agreed to proceed. Smoking obviously huge problem we had a long discussion about his hopefully he will be able to quit   Medication Adjustments/Labs and Tests Ordered: Current medicines are reviewed at length with the patient today.  Concerns regarding medicines are outlined above.  No orders of the defined types were placed in this encounter.  Medication changes: No orders of the defined types were placed in this encounter.   Signed, 11/03/21, MD, [facility] 11/07/2021 10:10 AM    [facility]

## 2021-11-07 NOTE — Addendum Note (Signed)
Addended by: Eleonore Chiquito on: 11/07/2021 10:40 AM   Modules accepted: Orders

## 2021-11-07 NOTE — Patient Instructions (Signed)
Medication Instructions:  Your physician has recommended you make the following change in your medication:   Start Imdur 30 mg daily.  Start Lipitor 40 mg daily.  *If you need a refill on your cardiac medications before your next appointment, please call your pharmacy*   Lab Work: Your physician recommends that you have a BMET and CBC today in the office for your upcoming procedure.  If you have labs (blood work) drawn today and your tests are completely normal, you will receive your results only by: MyChart Message (if you have MyChart) OR A paper copy in the mail If you have any lab test that is abnormal or we need to change your treatment, we will call you to review the results.   Testing/Procedures:  [facility] [place] [address] [place] [address] Dept: [phone] Loc: [phone]  FREDERIC TONES  11/07/2021  You are scheduled for a Cardiac Catheterization on Thursday, July 20 with Dr. Lance Muss.  1. Please arrive at the Main Entrance A at [facility]: [address] [place], [place] [address] at 7:00 AM (This time is two hours before your procedure to ensure your preparation). Free valet parking service is available.   Special note: Every effort is made to have your procedure done on time. Please understand that emergencies sometimes delay scheduled procedures.  2. Diet: Do not eat solid foods after midnight.  You may have clear liquids until 5 AM upon the day of the procedure.  3. Labs: You had your labs done today in the office.  4. Medication instructions in preparation for your procedure:   Contrast Allergy: No  Stop taking, [place] Wednesday, July 19,, Lasix (Furosemide)  Thursday, July 20,  On the morning of your procedure, take Aspirin and any morning medicines NOT listed above.  You may use sips of water.  5. Plan to go home the same day, you will only stay  overnight if medically necessary. 6. You MUST have a responsible adult to drive you home. 7. An adult MUST be with you the first 24 hours after you arrive home. 8. Bring a current list of your medications, and the last time and date medication taken. 9. Bring ID and current insurance cards. 10.Please wear clothes that are easy to get on and off and wear slip-on shoes.  Thank you for allowing [place] to care for you!   -- [place] [facility]    Follow-Up: At [facility], you and your health needs are our priority.  As part of our continuing mission to provide you with exceptional heart care, we have created designated Provider Care Teams.  These Care Teams include your primary Cardiologist (physician) and Advanced Practice Providers (APPs -  Physician Assistants and Nurse Practitioners) who all work together to provide you with the care you need, when you need it.  We recommend signing up for the patient portal called "MyChart".  Sign up information is provided on this After Visit Summary.  MyChart is used to connect with patients for Virtual Visits (Telemedicine).  Patients are able to view lab/test results, encounter notes, upcoming appointments, etc.  Non-urgent messages can be sent to your provider as well.   To learn more about what you can do with MyChart, go to [email or web address].    Your next appointment:   2 month(s)  The format for your next appointment:   In Person  Provider:   Gypsy Balsam, MD  Other Instructions  Coronary Angiogram With Stent Coronary angiogram with stent placement is a procedure to widen or open a narrow blood vessel of the heart (coronary artery). Arteries may become blocked by cholesterol buildup (plaques) in the lining of the artery wall. When a coronary artery becomes partially blocked, blood flow to that area decreases. This may lead to chest pain or a heart attack (myocardial infarction). A stent is a small piece of  metal that looks like mesh or spring. Stent placement may be done as treatment after a heart attack, or to prevent a heart attack if a blocked artery is found by a coronary angiogram. Let your health care provider know about: Any allergies you have, including allergies to medicines or contrast dye. All medicines you are taking, including vitamins, herbs, eye drops, creams, and over-the-counter medicines. Any problems you or family members have had with anesthetic medicines. Any blood disorders you have. Any surgeries you have had. Any medical conditions you have, including kidney problems or kidney failure. Whether you are pregnant or may be pregnant. Whether you are breastfeeding. What are the risks? Generally, this is a safe procedure. However, serious problems may occur, including: Damage to nearby structures or organs, such as the heart, blood vessels, or kidneys. A return of blockage. Bleeding, infection, or bruising at the insertion site. A collection of blood under the skin (hematoma) at the insertion site. A blood clot in another part of the body. Allergic reaction to medicines or dyes. Bleeding into the abdomen (retroperitoneal bleeding). Stroke (rare). Heart attack (rare). What happens before the procedure? Staying hydrated Follow instructions from your health care provider about hydration, which may include: Up to 2 hours before the procedure - you may continue to drink clear liquids, such as water, clear fruit juice, black coffee, and plain tea.    Eating and drinking restrictions Follow instructions from your health care provider about eating and drinking, which may include: 8 hours before the procedure - stop eating heavy meals or foods, such as meat, fried foods, or fatty foods. 6 hours before the procedure - stop eating light meals or foods, such as toast or cereal. 2 hours before the procedure - stop drinking clear liquids. Medicines Ask your health care provider  about: Changing or stopping your regular medicines. This is especially important if you are taking diabetes medicines or blood thinners. Taking medicines such as aspirin and ibuprofen. These medicines can thin your blood. Do not take these medicines unless your health care provider tells you to take them. Generally, aspirin is recommended before a thin tube, called a catheter, is passed through a blood vessel and inserted into the heart (cardiac catheterization). Taking over-the-counter medicines, vitamins, herbs, and supplements. General instructions Do not use any products that contain nicotine or tobacco for at least 4 weeks before the procedure. These products include cigarettes, e-cigarettes, and chewing tobacco. If you need help quitting, ask your health care provider. Plan to have someone take you home from the hospital or clinic. If you will be going home right after the procedure, plan to have someone with you for 24 hours. You may have tests and imaging procedures. Ask your health care provider: How your insertion site will be marked. Ask which artery will be used for the procedure. What steps will be taken to help prevent infection. These may include: Removing hair at the insertion site. Washing skin with a germ-killing soap. Taking antibiotic medicine. What happens during the procedure? An IV will be inserted into  one of your veins. Electrodes may be placed on your chest to monitor your heart rate during the procedure. You will be given one or more of the following: A medicine to help you relax (sedative). A medicine to numb the area (local anesthetic) for catheter insertion. A small incision will be made for catheter insertion. The catheter will be inserted into an artery using a guide wire. The location may be in your groin, your wrist, or the fold of your arm (near your elbow). An X-ray procedure (fluoroscopy) will be used to help guide the catheter to the opening of the heart  arteries. A dye will be injected into the catheter. X-rays will be taken. The dye helps to show where any narrowing or blockages are located in the arteries. Tell your health care provider if you have chest pain or trouble breathing. A tiny wire will be guided to the blocked spot, and a balloon will be inflated to make the artery wider. The stent will be expanded to crush the plaques into the wall of the vessel. The stent will hold the area open and improve the blood flow. Most stents have a drug coating to reduce the risk of the stent narrowing over time. The artery may be made wider using a drill, laser, or other tools that remove plaques. The catheter will be removed when the blood flow improves. The stent will stay where it was placed, and the lining of the artery will grow over it. A bandage (dressing) will be placed on the insertion site. Pressure will be applied to stop bleeding. The IV will be removed. This procedure may vary among health care providers and hospitals.    What happens after the procedure? Your blood pressure, heart rate, breathing rate, and blood oxygen level will be monitored until you leave the hospital or clinic. If the procedure is done through the leg, you will lie flat in bed for a few hours or for as long as told by your health care provider. You will be instructed not to bend or cross your legs. The insertion site and the pulse in your foot or wrist will be checked often. You may have more blood tests, X-rays, and a test that records the electrical activity of your heart (electrocardiogram, or ECG). Do not drive for 24 hours if you were given a sedative during your procedure. Summary Coronary angiogram with stent placement is a procedure to widen or open a narrowed coronary artery. This is done to treat heart problems. Before the procedure, let your health care provider know about all the medical conditions and surgeries you have or have had. This is a safe  procedure. However, some problems may occur, including damage to nearby structures or organs, bleeding, blood clots, or allergies. Follow your health care provider's instructions about eating, drinking, medicines, and other lifestyle changes, such as quitting tobacco use before the procedure. This information is not intended to replace advice given to you by your health care provider. Make sure you discuss any questions you have with your health care provider. Document Revised: 11/02/2018 Document Reviewed: 11/02/2018 Elsevier Patient Education  2021 [place].  Aspirin and Your Heart Aspirin is a medicine that prevents the platelets in your blood from sticking together. Platelets are the cells that your blood uses for clotting. Aspirin can be used to help reduce the risk of blood clots, heart attacks, and other heart-related problems. What are the risks? Daily use of aspirin can cause side effects. Some of these  include: Bleeding. Bleeding can be minor or serious. An example of minor bleeding is bleeding from a cut, and the bleeding does not stop. An example of more serious bleeding is stomach bleeding or, rarely, bleeding into the brain. Your risk of bleeding increases if you are also taking NSAIDs, such as ibuprofen. Increased bruising. Upset stomach. An allergic reaction. People who have growths inside the nose (nasal polyps) have an increased risk of developing an aspirin allergy. How to use aspirin to care for your heart Take aspirin only as told by your health care provider. Make sure that you understand how much to take and what form to take. The two forms of aspirin are: Non-enteric-coated.This type of aspirin does not have a coating and is absorbed quickly. This type of aspirin also comes in a chewable form. Enteric-coated. This type of aspirin has a coating that releases the medicine very slowly. Enteric-coated aspirin might cause less stomach upset than non-enteric-coated aspirin.  This type of aspirin should not be chewed or crushed. Work with your health care provider to find out whether it is safe and beneficial for you to take aspirin daily. Taking aspirin daily may be helpful if: You have had a heart attack or chest pain, or you are at risk for a heart attack. You have a condition in which certain heart vessels are blocked (coronary artery disease), and you have had a procedure to treat it. Examples are: Open-heart surgery, such as coronary artery bypass surgery (CABG). Coronary angioplasty,which is done to widen a blood vessel of your heart. Having a small mesh tube, or stent, placed in your coronary artery. You have had certain types of stroke or a mini-stroke known as a transient ischemic attack (TIA). You have a narrowing of the arteries that supply the limbs (peripheral artery disease, or PAD). You have long-term (chronic) heart rhythm problems, such as atrial fibrillation, and your health care provider thinks aspirin may help. You have valve disease or have had surgery on a valve. You are considered at increased risk of developing coronary artery disease or PAD.    Follow these instructions at home Medicines Take over-the-counter and prescription medicines only as told by your health care provider. If you are taking blood thinners: Talk with your health care provider before you take any medicines that contain aspirin or NSAIDs, such as ibuprofen. These medicines increase your risk for dangerous bleeding. Take your medicine exactly as told, at the same time every day. Avoid activities that could cause injury or bruising, and follow instructions about how to prevent falls. Wear a medical alert bracelet or carry a card that lists what medicines you take. General instructions Do not drink alcohol if: Your health care provider tells you not to drink. You are pregnant, may be pregnant, or are planning to become pregnant. If you drink alcohol: Limit how much you  use to: 0-1 drink a day for women. 0-2 drinks a day for men. Be aware of how much alcohol is in your drink. In the [place] one drink equals one 12 oz bottle of beer (355 mL), one 5 oz glass of wine (148 mL), or one 1 oz glass of hard liquor (44 mL). Keep all follow-up visits as told by your health care provider. This is important. Where to find more information The American Heart Association: www.heart.org Contact a health care provider if you have: Unusual bleeding or bruising. Stomach pain or nausea. Ringing in your ears. An allergic reaction that causes hives, itchy skin, or  swelling of the lips, tongue, or face. Get help right away if: You notice that your bowel movements are bloody, or dark red or black in color. You vomit or cough up blood. You have blood in your urine. You cough, breathe loudly (wheeze), or feel short of breath. You have chest pain, especially if the pain spreads to your arms, back, neck, or jaw. You have a headache with confusion. You have any symptoms of a stroke. "BE FAST" is an easy way to remember the main warning signs of a stroke: B - Balance. Signs are dizziness, sudden trouble walking, or loss of balance. E - Eyes. Signs are trouble seeing or a sudden change in vision. F - Face. Signs are sudden weakness or numbness of the face, or the face or eyelid drooping on one side. A - Arms. Signs are weakness or numbness in an arm. This happens suddenly and usually on one side of the body. S - Speech. Signs are sudden trouble speaking, slurred speech, or trouble understanding what people say. T - Time. Time to call emergency services. Write down what time symptoms started. You have other signs of a stroke, such as: A sudden, severe headache with no known cause. Nausea or vomiting. Seizure. These symptoms may represent a serious problem that is an emergency. Do not wait to see if the symptoms will go away. Get medical help right away. Call your local emergency  services (911 in the [place] Do not drive yourself to the hospital. Summary Aspirin use can help reduce the risk of blood clots, heart attacks, and other heart-related problems. Daily use of aspirin can cause side effects. Take aspirin only as told by your health care provider. Make sure that you understand how much to take and what form to take. Your health care provider will help you determine whether it is safe and beneficial for you to take aspirin daily. This information is not intended to replace advice given to you by your health care provider. Make sure you discuss any questions you have with your health care provider. Document Revised: 01/16/2019 Document Reviewed: 01/16/2019 Elsevier Patient Education  2021 [place]. Nitroglycerin sublingual tablets What is this medicine? NITROGLYCERIN (nye troe GLI ser in) is a type of vasodilator. It relaxes blood vessels, increasing the blood and oxygen supply to your heart. This medicine is used to relieve chest pain caused by angina. It is also used to prevent chest pain before activities like climbing stairs, going outdoors in cold weather, or sexual activity. This medicine may be used for other purposes; ask your health care provider or pharmacist if you have questions. COMMON BRAND NAME(S): Nitroquick, Nitrostat, Nitrotab What should I tell my health care provider before I take this medicine? They need to know if you have any of these conditions: anemia head injury, recent stroke, or bleeding in the brain liver disease previous heart attack an unusual or allergic reaction to nitroglycerin, other medicines, foods, dyes, or preservatives pregnant or trying to get pregnant breast-feeding How should I use this medicine? Take this medicine by mouth as needed. Use at the first sign of an angina attack (chest pain or tightness). You can also take this medicine 5 to 10 minutes before an event likely to produce chest pain. Follow the directions  exactly as written on the prescription label. Place one tablet under your tongue and let it dissolve. Do not swallow whole. Replace the dose if you accidentally swallow it. It will help if your mouth is not  dry. Saliva around the tablet will help it to dissolve more quickly. Do not eat or drink, smoke or chew tobacco while a tablet is dissolving. Sit down when taking this medicine. In an angina attack, you should feel better within 5 minutes after your first dose. You can take a dose every 5 minutes up to a total of 3 doses. If you do not feel better or feel worse after 1 dose, call 9-1-1 at once. Do not take more than 3 doses in 15 minutes. Your health care provider might give you other directions. Follow those directions if he or she does. Do not take your medicine more often than directed. Talk to your health care provider about the use of this medicine in children. Special care may be needed. Overdosage: If you think you have taken too much of this medicine contact a poison control center or emergency room at once. NOTE: This medicine is only for you. Do not share this medicine with others. What if I miss a dose? This does not apply. This medicine is only used as needed. What may interact with this medicine? Do not take this medicine with any of the following medications: certain migraine medicines like ergotamine and dihydroergotamine (DHE) medicines used to treat erectile dysfunction like sildenafil, tadalafil, and vardenafil riociguat This medicine may also interact with the following medications: alteplase aspirin heparin medicines for high blood pressure medicines for mental depression other medicines used to treat angina phenothiazines like chlorpromazine, mesoridazine, prochlorperazine, thioridazine This list may not describe all possible interactions. Give your health care provider a list of all the medicines, herbs, non-prescription drugs, or dietary supplements you use. Also tell  them if you smoke, drink alcohol, or use illegal drugs. Some items may interact with your medicine. What should I watch for while using this medicine? Tell your doctor or health care professional if you feel your medicine is no longer working. Keep this medicine with you at all times. Sit or lie down when you take your medicine to prevent falling if you feel dizzy or faint after using it. Try to remain calm. This will help you to feel better faster. If you feel dizzy, take several deep breaths and lie down with your feet propped up, or bend forward with your head resting between your knees. You may get drowsy or dizzy. Do not drive, use machinery, or do anything that needs mental alertness until you know how this drug affects you. Do not stand or sit up quickly, especially if you are an older patient. This reduces the risk of dizzy or fainting spells. Alcohol can make you more drowsy and dizzy. Avoid alcoholic drinks. Do not treat yourself for coughs, colds, or pain while you are taking this medicine without asking your doctor or health care professional for advice. Some ingredients may increase your blood pressure. What side effects may I notice from receiving this medicine? Side effects that you should report to your doctor or health care professional as soon as possible: allergic reactions (skin rash, itching or hives; swelling of the face, lips, or tongue) low blood pressure (dizziness; feeling faint or lightheaded, falls; unusually weak or tired) low red blood cell counts (trouble breathing; feeling faint; lightheaded, falls; unusually weak or tired) Side effects that usually do not require medical attention (report to your doctor or health care professional if they continue or are bothersome): facial flushing (redness) headache nausea, vomiting This list may not describe all possible side effects. Call your doctor for medical advice  about side effects. You may report side effects to FDA at  1-800-FDA-1088. Where should I keep my medicine? Keep out of the reach of children. Store at room temperature between 20 and 25 degrees C (68 and 77 degrees F). Store in [employer]. Protect from light and moisture. Keep tightly closed. Throw away any unused medicine after the expiration date. NOTE: This sheet is a summary. It may not cover all possible information. If you have questions about this medicine, talk to your doctor, pharmacist, or health care provider.  2021 Elsevier/Gold Standard (2018-01-12 16:46:32)

## 2021-11-07 NOTE — H&P (View-Only) (Signed)
Cardiology Office Note:    Date:  11/07/2021   ID:  [name], DOB [birth year], MRN [id number]  PCP:  Buckner Malta, MD  Cardiologist:  Gypsy Balsam, MD    Referring MD: Buckner Malta, MD   Chief Complaint  Patient presents with   Results    History of Present Illness:    [name] is a 66 y.o. male with past medical history significant for COPD which is advanced, he is oxygen all the time, lifelong smoking which he still continues, essential hypertension, borderline diabetes, dyslipidemia he was referred to [place] because apparently 20 years ago he had echocardiogram showing diminished ejection fraction and some segmental motion abnormalities.  Echocardiogram however has been repeated did not show any abnormality with preserved ejection fraction.  Because of symptomatology namely shortness of breath as well as some chest tightness he did have a stress test done stress to show ischemia involving inferior wall.  I brought him back to my office today to talk about options for this situation.  We discussed option of medical therapy versus cardiac catheterization.  When we discussed this option he said that he started having pain more frequently in the matter-of-fact he woke up last night with chest pain that lasted for about 20 minutes.  He does not do much he lives a fairly sedentary lifestyle.  Usually chest pain happen at rest.  He does have history of GERD and he cannot distinguish if the pain that he describes is because of GERD or because of some heart issues.  Past Medical History:  Diagnosis Date   Anxiety    went off medicines for anxiety 6 months ago ( 12/2010), pt. "stays nervous", per pt.    Arthritis    lumbar stenosis    Benign prostatic hyperplasia with lower urinary tract symptoms    Chronic pain syndrome    Chronic pansinusitis    Chronic pharyngitis    Compression fracture of thoracic vertebra (HCC)    T11,T12 and L1   Contusion of thorax, unspecified,  initial encounter    COPD (chronic obstructive pulmonary disease) (HCC)    Cysts    groin area (on & off- for 4 +yrs.) for which he uses Doxycyline on going, BID, Rx fr. Dr. Fatima Blank in [place], [place].,     Dependence on supplemental oxygen    Depression    Diabetes mellitus [facility])    denied DM2 history 11/24/19; A1c 6.1% 02/02/18 (pre-diabetes)   Drug induced constipation    Dysphagia    Finger infection    05/2011, L hand, index finger  - I&D in MD office, treated /w antibiotic.Marland KitchenMarland KitchenMarland Kitchen?name   GERD (gastroesophageal reflux disease)    Headache(784.0)    uses excedrin prn   History of colon polyps    History of normal resting EKG    2009, preop   Hypertension    Long term (current) use of opiate analgesic    Myocardial infarction [facility])    denied MI, but reported LVF mildly decreased in the past 11/24/19; LVEF 50-55% 2018 by echo, LVEF 48% on non-ischemic Dobutamine stress test 01/2015   Neuromuscular disorder (HCC)    pseudoarthrosis    Normal cardiac stress test    2005, done prior to back surgery   Other chronic nonsuppurative otitis media, bilateral    Shortness of breath    pt. reports related to pain    Sleep apnea    study- 2012[facility] , Dr. Blenda Nicely, [address]  , uses  CPAP  qo night    Wears glasses     Past Surgical History:  Procedure Laterality Date   APPENDECTOMY     as a teen    BACK SURGERY     multiple, lumbar & cerv. fusions, last low back- 2009   COLONOSCOPY  06/06/2010   Colon polyps- status post polypectomy. Moderate predominantly sigmoid diverticulosis. Internal hemorrhoids.   ESOPHAGOGASTRODUODENOSCOPY  06/14/2008   Mild gastritis. Otherwise normal EGD.    HEMORROIDECTOMY     2012[facility]   KYPHOPLASTY N/A 11/27/2019   Procedure: THORACIC ELEVEN, THORACIC TWELVE, LUMBAR ONE KYPHOPLASTY;  Surgeon: Barnett Abu, MD;  Location: [facility] OR;  Service: Neurosurgery;  Laterality: N/A;   NECK SURGERY      Current Medications: Current Meds   Medication Sig   albuterol (PROVENTIL) (2.5 MG/3ML) 0.083% nebulizer solution Take 2.5 mg by nebulization every 6 (six) hours as needed for wheezing or shortness of breath.   albuterol (VENTOLIN HFA) 108 (90 Base) MCG/ACT inhaler Inhale 1-2 puffs into the lungs every 6 (six) hours as needed for wheezing or shortness of breath.   Aspirin-Acetaminophen-Caffeine (GOODYS EXTRA STRENGTH) 500-325-65 MG PACK Take 1 packet by mouth daily as needed (pain).   busPIRone (BUSPAR) 30 MG tablet Take 30 mg by mouth 3 (three) times daily.   DULoxetine (CYMBALTA) 60 MG capsule Take 60 mg by mouth daily.   fluticasone (FLONASE) 50 MCG/ACT nasal spray Place 1 spray into both nostrils daily as needed for allergies or rhinitis.   furosemide (LASIX) 20 MG tablet Take 20-40 mg by mouth daily.   gabapentin (NEURONTIN) 300 MG capsule Take 300 mg by mouth 3 (three) times daily.   metoprolol succinate (TOPROL-XL) 50 MG 24 hr tablet Take 50 mg by mouth daily. Take with or immediately following a meal.   oxyCODONE (ROXICODONE) 15 MG immediate release tablet Take 15 mg by mouth 4 (four) times daily as needed for pain.   predniSONE (DELTASONE) 5 MG tablet Take 5 mg by mouth daily.   Tamsulosin HCl (FLOMAX) 0.4 MG CAPS Take 0.4 mg by mouth daily.    tiZANidine (ZANAFLEX) 4 MG capsule Take 4 mg by mouth 3 (three) times daily.   traZODone (DESYREL) 50 MG tablet Take 100 mg by mouth at bedtime. For sleep   [DISCONTINUED] cholecalciferol (VITAMIN D3) 25 MCG (1000 UNIT) tablet Take 1,000 Units by mouth daily.   [DISCONTINUED] doxycycline (VIBRAMYCIN) 100 MG capsule Take 100 mg by mouth 2 (two) times daily.     Allergies:   Strawberry extract   Social History   Socioeconomic History   Marital status: Married    Spouse name: Not on file   Number of children: Not on file   Years of education: Not on file   Highest education level: Not on file  Occupational History   Not on file  Tobacco Use   Smoking status: Every Day     Packs/day: 1.00    Types: Cigarettes   Smokeless tobacco: Never  Vaping Use   Vaping Use: Never used  Substance and Sexual Activity   Alcohol use: Not Currently   Drug use: No   Sexual activity: Not on file  Other Topics Concern   Not on file  Social History Narrative   Not on file   Social Determinants of Health   Financial Resource Strain: Not on file  Food Insecurity: Not on file  Transportation Needs: Not on file  Physical Activity: Not on file  Stress: Not on file  Social Connections: Not on file     Family History: The patient's family history includes Alcoholism in his father and mother; Coronary artery disease in his father; Hypertension in his father. There is no history of Anesthesia problems. ROS:   Please see the history of present illness.    All 14 point review of systems negative except as described per history of present illness  EKGs/Labs/Other Studies Reviewed:      Recent Labs: No results found for requested labs within last 365 days.  Recent Lipid Panel No results found for: "CHOL", "TRIG", "HDL", "CHOLHDL", "VLDL", "LDLCALC", "LDLDIRECT"  Physical Exam:    VS:  BP (!) 168/90 (BP Location: Left Arm, Patient Position: Sitting)   Pulse 65   Ht 5\' 7"  (1.702 m)   Wt 268 lb 9.6 oz (121.8 kg)   SpO2 93%   BMI 42.07 kg/m     Wt Readings from Last 3 Encounters:  11/07/21 268 lb 9.6 oz (121.8 kg)  10/29/21 262 lb (118.8 kg)  09/03/21 262 lb (118.8 kg)     GEN:  Well nourished, well developed in no acute distress HEENT: Normal NECK: No JVD; No carotid bruits LYMPHATICS: No lymphadenopathy CARDIAC: RRR, no murmurs, no rubs, no gallops RESPIRATORY: Poor air entry with bilateral wheezes ABDOMEN: Soft, non-tender, non-distended MUSCULOSKELETAL:  No edema; No deformity  SKIN: Warm and dry LOWER EXTREMITIES: no swelling NEUROLOGIC:  Alert and oriented x 3 PSYCHIATRIC:  Normal affect   ASSESSMENT:    1. Dilated cardiomyopathy (HCC)   2.  Coronary artery disease of native artery of native heart with stable angina pectoris (HCC)   3. Gastroesophageal reflux disease, unspecified whether esophagitis present   4. Abnormal stress test inferior wall ischemia    PLAN:    In order of problems listed above:  Coronary artery disease, stress test showing inferior wall ischemia.  He is taking a lot of Goody powder therefore he took sent off aspirin.  I will put him isosorbide mononitrate.  He is already on beta-blocker which I will continue.  I will also initiate statin therapy. Dilated cardiomyopathy last echocardiogram showed preserved left ventricular ejection fraction. Gastroesophageal reflux disease.  The truth is that his symptomatology could be related to his GI tract however with multiple risk factors for coronary artery disease and abnormal stress test he we must rule out obstructive disease and cardiac catheterization will be done Abnormal stress test: I have a long discussion with him describe risk benefits alternatives of cardiac catheterization he agreed to proceed. Smoking obviously huge problem we had a long discussion about his hopefully he will be able to quit   Medication Adjustments/Labs and Tests Ordered: Current medicines are reviewed at length with the patient today.  Concerns regarding medicines are outlined above.  No orders of the defined types were placed in this encounter.  Medication changes: No orders of the defined types were placed in this encounter.   Signed, 11/03/21, MD, [facility] 11/07/2021 10:10 AM    [facility]

## 2021-11-11 ENCOUNTER — Other Ambulatory Visit: Payer: Self-pay

## 2021-11-11 ENCOUNTER — Telehealth: Payer: Self-pay | Admitting: *Deleted

## 2021-11-11 ENCOUNTER — Telehealth: Payer: Self-pay | Admitting: Cardiology

## 2021-11-11 ENCOUNTER — Telehealth: Payer: Self-pay

## 2021-11-11 DIAGNOSIS — R9439 Abnormal result of other cardiovascular function study: Secondary | ICD-10-CM

## 2021-11-11 DIAGNOSIS — Z01812 Encounter for preprocedural laboratory examination: Secondary | ICD-10-CM

## 2021-11-11 NOTE — Telephone Encounter (Signed)
Cardiac Catheterization scheduled at [facility] for: Thursday November 13, 2021 9 AM Arrival time and place: [facility] Main Entrance A at: 7 AM   Nothing to eat after midnight prior to procedure, clear liquids until 5 AM day of procedure.  Medication instructions: -Hold:  Lasix-AM of procedure -Except hold medications usual morning medications can be taken with sips of water including aspirin 81 mg.  Confirmed patient has responsible adult to drive home post procedure and be with patient first 24 hours after arriving home.  Patient reports no new symptoms concerning for COVID-19 in the past 10 days.  Reviewed procedure instructions with patient.

## 2021-11-11 NOTE — Telephone Encounter (Signed)
-  Reached pt to have him come in for CBC and EKG for cardiac cath on 11-13-21. He will come 11-12-21 at [facility]

## 2021-11-11 NOTE — Telephone Encounter (Signed)
Pt returning nurse's call. Please advise

## 2021-11-11 NOTE — Progress Notes (Unsigned)
CBC ordered stat per Katina Dung, RN for Cardiac Cath on 11-13-21

## 2021-11-11 NOTE — Telephone Encounter (Signed)
LVM to discuss lab and to get CBC and EKG before cardiac Cath.

## 2021-11-11 NOTE — Addendum Note (Signed)
Addended by: Eleonore Chiquito on: 11/11/2021 07:10 AM   Modules accepted: Orders

## 2021-11-12 ENCOUNTER — Ambulatory Visit (INDEPENDENT_AMBULATORY_CARE_PROVIDER_SITE_OTHER): Payer: Medicare Other

## 2021-11-12 VITALS — BP 140/94 | HR 61 | Ht 67.0 in | Wt 271.0 lb

## 2021-11-12 DIAGNOSIS — Z01818 Encounter for other preprocedural examination: Secondary | ICD-10-CM | POA: Diagnosis not present

## 2021-11-12 DIAGNOSIS — R9439 Abnormal result of other cardiovascular function study: Secondary | ICD-10-CM | POA: Diagnosis not present

## 2021-11-12 DIAGNOSIS — I25118 Atherosclerotic heart disease of native coronary artery with other forms of angina pectoris: Secondary | ICD-10-CM | POA: Diagnosis not present

## 2021-11-12 LAB — CBC
Hematocrit: 43.5 % (ref 37.5–51.0)
Hemoglobin: 15 g/dL (ref 13.0–17.7)
MCH: 32.2 pg (ref 26.6–33.0)
MCHC: 34.5 g/dL (ref 31.5–35.7)
MCV: 93 fL (ref 79–97)
Platelets: 187 10*3/uL (ref 150–450)
RBC: 4.66 x10E6/uL (ref 4.14–5.80)
RDW: 12.9 % (ref 11.6–15.4)
WBC: 8.3 10*3/uL (ref 3.4–10.8)

## 2021-11-12 NOTE — Progress Notes (Signed)
   Nurse Visit   Date of Encounter: 11/12/2021 ID: Si Raider, DOB 09/05/1955, MRN [id number]  PCP:  Buckner Malta, MD   [facility] Providers Cardiologist:  Dr. Damian Leavell to update primary MD,subspecialty MD or APP then REFRESH:1}     Visit Details   VS:  BP (!) 140/94 (BP Location: Right Arm, Patient Position: Sitting, Cuff Size: Normal)   Pulse 61   Ht 5\' 7"  (1.702 m)   Wt 271 lb (122.9 kg)   SpO2 94%   BMI 42.44 kg/m  , BMI Body mass index is 42.44 kg/m.  Wt Readings from Last 3 Encounters:  11/12/21 271 lb (122.9 kg)  11/07/21 268 lb 9.6 oz (121.8 kg)  10/29/21 262 lb (118.8 kg)     Reason for visit: Patient to have vital signs, EKG and blood work pre-cath Performed today: Vital signs, EKG, Provider consulted and Education provided. Changes (medications, testing, etc.) : No new orders at this time Length of Visit: 25 minutes    Medications Adjustments/Labs and Tests Ordered: Orders Placed This Encounter  Procedures   EKG 12-Lead   No orders of the defined types were placed in this encounter.    Signed, 12/30/21, RN  11/12/2021 9:33 AM

## 2021-11-13 ENCOUNTER — Encounter (HOSPITAL_COMMUNITY): Payer: Self-pay | Admitting: Interventional Cardiology

## 2021-11-13 ENCOUNTER — Other Ambulatory Visit: Payer: Self-pay

## 2021-11-13 ENCOUNTER — Encounter (HOSPITAL_COMMUNITY)
Admission: RE | Disposition: A | Discharge status: Home / Self Care | Payer: Self-pay | Source: Ambulatory Visit | Attending: Interventional Cardiology

## 2021-11-13 ENCOUNTER — Ambulatory Visit (HOSPITAL_COMMUNITY)
Admission: RE | Admit: 2021-11-13 | Discharge: 2021-11-13 | Disposition: A | Discharge status: Home / Self Care | Payer: Medicare Other | Source: Ambulatory Visit | Attending: Interventional Cardiology | Admitting: Interventional Cardiology

## 2021-11-13 DIAGNOSIS — Z9981 Dependence on supplemental oxygen: Secondary | ICD-10-CM | POA: Insufficient documentation

## 2021-11-13 DIAGNOSIS — I2510 Atherosclerotic heart disease of native coronary artery without angina pectoris: Secondary | ICD-10-CM | POA: Diagnosis not present

## 2021-11-13 DIAGNOSIS — J449 Chronic obstructive pulmonary disease, unspecified: Secondary | ICD-10-CM | POA: Diagnosis not present

## 2021-11-13 DIAGNOSIS — E119 Type 2 diabetes mellitus without complications: Secondary | ICD-10-CM | POA: Insufficient documentation

## 2021-11-13 DIAGNOSIS — I252 Old myocardial infarction: Secondary | ICD-10-CM | POA: Diagnosis not present

## 2021-11-13 DIAGNOSIS — I25118 Atherosclerotic heart disease of native coronary artery with other forms of angina pectoris: Secondary | ICD-10-CM | POA: Insufficient documentation

## 2021-11-13 DIAGNOSIS — I2582 Chronic total occlusion of coronary artery: Secondary | ICD-10-CM | POA: Insufficient documentation

## 2021-11-13 DIAGNOSIS — I420 Dilated cardiomyopathy: Secondary | ICD-10-CM | POA: Insufficient documentation

## 2021-11-13 DIAGNOSIS — R9439 Abnormal result of other cardiovascular function study: Secondary | ICD-10-CM | POA: Diagnosis not present

## 2021-11-13 DIAGNOSIS — F17210 Nicotine dependence, cigarettes, uncomplicated: Secondary | ICD-10-CM | POA: Diagnosis not present

## 2021-11-13 DIAGNOSIS — I10 Essential (primary) hypertension: Secondary | ICD-10-CM | POA: Diagnosis not present

## 2021-11-13 DIAGNOSIS — E785 Hyperlipidemia, unspecified: Secondary | ICD-10-CM | POA: Diagnosis not present

## 2021-11-13 DIAGNOSIS — K219 Gastro-esophageal reflux disease without esophagitis: Secondary | ICD-10-CM | POA: Insufficient documentation

## 2021-11-13 HISTORY — PX: LEFT HEART CATH AND CORONARY ANGIOGRAPHY: CATH118249

## 2021-11-13 SURGERY — LEFT HEART CATH AND CORONARY ANGIOGRAPHY
Anesthesia: LOCAL

## 2021-11-13 MED ORDER — SODIUM CHLORIDE 0.9 % IV SOLN: 250.0000 mL | INTRAVENOUS | Status: DC | PRN

## 2021-11-13 MED ORDER — LABETALOL HCL 5 MG/ML IV SOLN: 10.0000 mg | INTRAVENOUS | Status: DC | PRN

## 2021-11-13 MED ORDER — SODIUM CHLORIDE 0.9% FLUSH: 3.0000 mL | Freq: Two times a day (BID) | INTRAVENOUS | Status: DC

## 2021-11-13 MED ORDER — HEPARIN (PORCINE) IN NACL 1000-0.9 UT/500ML-% IV SOLN
INTRAVENOUS | Status: AC
  Filled 2021-11-13: qty 1000

## 2021-11-13 MED ORDER — SODIUM CHLORIDE 0.9% FLUSH: 3.0000 mL | INTRAVENOUS | Status: DC | PRN

## 2021-11-13 MED ORDER — HEPARIN SODIUM (PORCINE) 1000 UNIT/ML IJ SOLN
INTRAMUSCULAR | Status: AC
  Filled 2021-11-13: qty 10

## 2021-11-13 MED ORDER — ONDANSETRON HCL 4 MG/2ML IJ SOLN: 4.0000 mg | Freq: Four times a day (QID) | INTRAMUSCULAR | Status: DC | PRN

## 2021-11-13 MED ORDER — LIDOCAINE HCL (PF) 1 % IJ SOLN
INTRAMUSCULAR | Status: AC
  Filled 2021-11-13: qty 30

## 2021-11-13 MED ORDER — VERAPAMIL HCL 2.5 MG/ML IV SOLN
INTRAVENOUS | Status: DC | PRN
  Administered 2021-11-13: 10 mL via INTRA_ARTERIAL

## 2021-11-13 MED ORDER — HEPARIN (PORCINE) IN NACL 1000-0.9 UT/500ML-% IV SOLN
INTRAVENOUS | Status: DC | PRN
  Administered 2021-11-13 (×2): 500 mL

## 2021-11-13 MED ORDER — VERAPAMIL HCL 2.5 MG/ML IV SOLN
INTRAVENOUS | Status: DC | PRN
  Administered 2021-11-13: 1.8 mg via INTRAVENOUS

## 2021-11-13 MED ORDER — FENTANYL CITRATE (PF) 100 MCG/2ML IJ SOLN
INTRAMUSCULAR | Status: DC | PRN
  Administered 2021-11-13: 25 ug via INTRAVENOUS

## 2021-11-13 MED ORDER — LIDOCAINE HCL (PF) 1 % IJ SOLN
INTRAMUSCULAR | Status: DC | PRN
  Administered 2021-11-13: 2 mL

## 2021-11-13 MED ORDER — MIDAZOLAM HCL 2 MG/2ML IJ SOLN
INTRAMUSCULAR | Status: DC | PRN
  Administered 2021-11-13: 2 mg via INTRAVENOUS

## 2021-11-13 MED ORDER — HEPARIN SODIUM (PORCINE) 1000 UNIT/ML IJ SOLN
INTRAMUSCULAR | Status: DC | PRN
  Administered 2021-11-13: 6000 [IU] via INTRAVENOUS

## 2021-11-13 MED ORDER — ASPIRIN 81 MG PO CHEW
81.0000 mg | CHEWABLE_TABLET | ORAL | Status: AC
  Administered 2021-11-13: 81 mg via ORAL
  Filled 2021-11-13: qty 1

## 2021-11-13 MED ORDER — SODIUM CHLORIDE 0.9 % IV SOLN
INTRAVENOUS | Status: AC
Start: 2021-11-13 — End: 2021-11-13

## 2021-11-13 MED ORDER — IOHEXOL 350 MG/ML SOLN
INTRAVENOUS | Status: DC | PRN
  Administered 2021-11-13: 60 mL

## 2021-11-13 MED ORDER — FENTANYL CITRATE (PF) 100 MCG/2ML IJ SOLN
INTRAMUSCULAR | Status: AC
  Filled 2021-11-13: qty 2

## 2021-11-13 MED ORDER — HYDRALAZINE HCL 20 MG/ML IJ SOLN: 10.0000 mg | INTRAMUSCULAR | Status: DC | PRN

## 2021-11-13 MED ORDER — SODIUM CHLORIDE 0.9 % IV SOLN: INTRAVENOUS | Status: DC

## 2021-11-13 MED ORDER — VERAPAMIL HCL 2.5 MG/ML IV SOLN
INTRAVENOUS | Status: AC
  Filled 2021-11-13: qty 2

## 2021-11-13 MED ORDER — ACETAMINOPHEN 325 MG PO TABS
650.0000 mg | ORAL_TABLET | ORAL | Status: DC | PRN
Start: 2021-11-13 — End: 2021-11-13

## 2021-11-13 MED ORDER — MIDAZOLAM HCL 2 MG/2ML IJ SOLN
INTRAMUSCULAR | Status: AC
  Filled 2021-11-13: qty 2

## 2021-11-13 SURGICAL SUPPLY — 10 items
BAND CMPR LRG ZPHR (HEMOSTASIS) ×1
BAND ZEPHYR COMPRESS 30 LONG (HEMOSTASIS) ×1 IMPLANT
CATH 5FR JL3.5 JR4 ANG PIG MP (CATHETERS) ×1 IMPLANT
GLIDESHEATH SLEND SS 6F .021 (SHEATH) ×1 IMPLANT
GUIDEWIRE INQWIRE 1.5J.035X260 (WIRE) IMPLANT
INQWIRE 1.5J .035X260CM (WIRE) ×2
KIT HEART LEFT (KITS) ×2 IMPLANT
PACK CARDIAC CATHETERIZATION (CUSTOM PROCEDURE TRAY) ×2 IMPLANT
TRANSDUCER W/STOPCOCK (MISCELLANEOUS) ×2 IMPLANT
TUBING CIL FLEX 10 FLL-RA (TUBING) ×2 IMPLANT

## 2021-11-13 NOTE — Interval H&P Note (Signed)
Cath Lab Visit (complete for each Cath Lab visit)  Clinical Evaluation Leading to the Procedure:   ACS: No.  Non-ACS:    Anginal Classification: CCS III  Anti-ischemic medical therapy: Minimal Therapy (1 class of medications)  Non-Invasive Test Results: Intermediate-risk stress test findings: cardiac mortality 1-3%/year  Prior CABG: No previous CABG      History and Physical Interval Note:  11/13/2021 8:32 AM  [name]  has presented today for surgery, with the diagnosis of CAD, abnormal stress test.  The various methods of treatment have been discussed with the patient and family. After consideration of risks, benefits and other options for treatment, the patient has consented to  Procedure(s): LEFT HEART CATH AND CORONARY ANGIOGRAPHY (N/A) as a surgical intervention.  The patient's history has been reviewed, patient examined, no change in status, stable for surgery.  I have reviewed the patient's chart and labs.  Questions were answered to the patient's satisfaction.     Lance Muss

## 2021-11-28 DIAGNOSIS — J449 Chronic obstructive pulmonary disease, unspecified: Secondary | ICD-10-CM | POA: Diagnosis not present

## 2021-12-02 DIAGNOSIS — Z79891 Long term (current) use of opiate analgesic: Secondary | ICD-10-CM | POA: Diagnosis not present

## 2021-12-02 DIAGNOSIS — M549 Dorsalgia, unspecified: Secondary | ICD-10-CM | POA: Diagnosis not present

## 2021-12-02 DIAGNOSIS — Z1389 Encounter for screening for other disorder: Secondary | ICD-10-CM | POA: Diagnosis not present

## 2021-12-02 DIAGNOSIS — G894 Chronic pain syndrome: Secondary | ICD-10-CM | POA: Diagnosis not present

## 2021-12-29 DIAGNOSIS — J449 Chronic obstructive pulmonary disease, unspecified: Secondary | ICD-10-CM | POA: Diagnosis not present

## 2021-12-31 DIAGNOSIS — Z1389 Encounter for screening for other disorder: Secondary | ICD-10-CM | POA: Diagnosis not present

## 2021-12-31 DIAGNOSIS — G894 Chronic pain syndrome: Secondary | ICD-10-CM | POA: Diagnosis not present

## 2021-12-31 DIAGNOSIS — M549 Dorsalgia, unspecified: Secondary | ICD-10-CM | POA: Diagnosis not present

## 2021-12-31 DIAGNOSIS — Z79891 Long term (current) use of opiate analgesic: Secondary | ICD-10-CM | POA: Diagnosis not present

## 2022-01-09 ENCOUNTER — Ambulatory Visit: Payer: Medicare Other | Admitting: Cardiology

## 2022-01-12 DIAGNOSIS — I110 Hypertensive heart disease with heart failure: Secondary | ICD-10-CM | POA: Diagnosis not present

## 2022-01-12 DIAGNOSIS — F17200 Nicotine dependence, unspecified, uncomplicated: Secondary | ICD-10-CM | POA: Diagnosis not present

## 2022-01-12 DIAGNOSIS — J439 Emphysema, unspecified: Secondary | ICD-10-CM | POA: Diagnosis not present

## 2022-01-12 DIAGNOSIS — I43 Cardiomyopathy in diseases classified elsewhere: Secondary | ICD-10-CM | POA: Diagnosis not present

## 2022-01-28 DIAGNOSIS — Z79891 Long term (current) use of opiate analgesic: Secondary | ICD-10-CM | POA: Diagnosis not present

## 2022-01-28 DIAGNOSIS — J449 Chronic obstructive pulmonary disease, unspecified: Secondary | ICD-10-CM | POA: Diagnosis not present

## 2022-01-28 DIAGNOSIS — G894 Chronic pain syndrome: Secondary | ICD-10-CM | POA: Diagnosis not present

## 2022-01-28 DIAGNOSIS — M549 Dorsalgia, unspecified: Secondary | ICD-10-CM | POA: Diagnosis not present

## 2022-01-28 DIAGNOSIS — Z1389 Encounter for screening for other disorder: Secondary | ICD-10-CM | POA: Diagnosis not present

## 2022-02-04 DIAGNOSIS — Z0000 Encounter for general adult medical examination without abnormal findings: Secondary | ICD-10-CM | POA: Diagnosis not present

## 2022-02-04 DIAGNOSIS — Z23 Encounter for immunization: Secondary | ICD-10-CM | POA: Diagnosis not present

## 2022-02-04 DIAGNOSIS — J441 Chronic obstructive pulmonary disease with (acute) exacerbation: Secondary | ICD-10-CM | POA: Diagnosis not present

## 2022-02-04 DIAGNOSIS — Z79899 Other long term (current) drug therapy: Secondary | ICD-10-CM | POA: Diagnosis not present

## 2022-02-04 DIAGNOSIS — R7302 Impaired glucose tolerance (oral): Secondary | ICD-10-CM | POA: Diagnosis not present

## 2022-02-24 DIAGNOSIS — Z1211 Encounter for screening for malignant neoplasm of colon: Secondary | ICD-10-CM | POA: Diagnosis not present

## 2022-02-24 DIAGNOSIS — Z1212 Encounter for screening for malignant neoplasm of rectum: Secondary | ICD-10-CM | POA: Diagnosis not present

## 2022-02-25 DIAGNOSIS — F17200 Nicotine dependence, unspecified, uncomplicated: Secondary | ICD-10-CM | POA: Diagnosis not present

## 2022-02-25 DIAGNOSIS — J441 Chronic obstructive pulmonary disease with (acute) exacerbation: Secondary | ICD-10-CM | POA: Diagnosis not present

## 2022-02-25 DIAGNOSIS — J309 Allergic rhinitis, unspecified: Secondary | ICD-10-CM | POA: Diagnosis not present

## 2022-02-26 DIAGNOSIS — Z79891 Long term (current) use of opiate analgesic: Secondary | ICD-10-CM | POA: Diagnosis not present

## 2022-02-26 DIAGNOSIS — Z1389 Encounter for screening for other disorder: Secondary | ICD-10-CM | POA: Diagnosis not present

## 2022-02-26 DIAGNOSIS — M549 Dorsalgia, unspecified: Secondary | ICD-10-CM | POA: Diagnosis not present

## 2022-02-26 DIAGNOSIS — G894 Chronic pain syndrome: Secondary | ICD-10-CM | POA: Diagnosis not present

## 2022-02-28 DIAGNOSIS — J449 Chronic obstructive pulmonary disease, unspecified: Secondary | ICD-10-CM | POA: Diagnosis not present

## 2022-03-24 ENCOUNTER — Encounter: Payer: Self-pay | Admitting: Gastroenterology

## 2022-03-27 DIAGNOSIS — Z1389 Encounter for screening for other disorder: Secondary | ICD-10-CM | POA: Diagnosis not present

## 2022-03-27 DIAGNOSIS — G894 Chronic pain syndrome: Secondary | ICD-10-CM | POA: Diagnosis not present

## 2022-03-27 DIAGNOSIS — M47816 Spondylosis without myelopathy or radiculopathy, lumbar region: Secondary | ICD-10-CM | POA: Diagnosis not present

## 2022-03-27 DIAGNOSIS — M47812 Spondylosis without myelopathy or radiculopathy, cervical region: Secondary | ICD-10-CM | POA: Diagnosis not present

## 2022-03-30 DIAGNOSIS — J449 Chronic obstructive pulmonary disease, unspecified: Secondary | ICD-10-CM | POA: Diagnosis not present

## 2022-04-01 ENCOUNTER — Telehealth: Payer: Self-pay

## 2022-04-01 ENCOUNTER — Encounter: Payer: Self-pay | Admitting: Cardiology

## 2022-04-01 ENCOUNTER — Ambulatory Visit: Payer: Medicare Other | Attending: Cardiology | Admitting: Cardiology

## 2022-04-01 ENCOUNTER — Ambulatory Visit: Payer: Medicare Other | Attending: Cardiology

## 2022-04-01 VITALS — BP 138/90 | HR 76 | Ht 67.0 in | Wt 280.0 lb

## 2022-04-01 DIAGNOSIS — I420 Dilated cardiomyopathy: Secondary | ICD-10-CM | POA: Diagnosis not present

## 2022-04-01 DIAGNOSIS — J432 Centrilobular emphysema: Secondary | ICD-10-CM

## 2022-04-01 DIAGNOSIS — M79604 Pain in right leg: Secondary | ICD-10-CM

## 2022-04-01 DIAGNOSIS — R0609 Other forms of dyspnea: Secondary | ICD-10-CM | POA: Diagnosis not present

## 2022-04-01 DIAGNOSIS — I25118 Atherosclerotic heart disease of native coronary artery with other forms of angina pectoris: Secondary | ICD-10-CM | POA: Diagnosis not present

## 2022-04-01 DIAGNOSIS — M79605 Pain in left leg: Secondary | ICD-10-CM

## 2022-04-01 DIAGNOSIS — I10 Essential (primary) hypertension: Secondary | ICD-10-CM | POA: Diagnosis not present

## 2022-04-01 MED ORDER — APIXABAN 5 MG PO TABS: ORAL_TABLET | ORAL | 3 refills | Status: DC

## 2022-04-01 NOTE — Addendum Note (Signed)
Addended by: Baldo Ash D on: 04/01/2022 02:10 PM   Modules accepted: Orders

## 2022-04-01 NOTE — Patient Instructions (Addendum)
Medication Instructions:    Lasix 40mg  daily  Pt positive for DVT. He will start Eliquis 10mg  BID for 7 days then Eliquis 5 mg BID   Lab Work: 2nd floor - [address] CMP, CBC, ProBNP, Direct LDL If you have labs (blood work) drawn today and your tests are completely normal, you will receive your results only by: (if you have MyChart) OR A paper copy in the mail If you have any lab test that is abnormal or we need to change your treatment, we will call you to review the results.   Testing/Procedures: Your physician has requested that you have a lower or upper extremity venous duplex. This test is an ultrasound of the veins in the legs or arms. It looks at venous blood flow that carries blood from the heart to the legs or arms. Allow one hour for a Lower Venous exam. Allow thirty minutes for an Upper Venous exam. There are no restrictions or special instructions.    Follow-Up: At [facility], you and your health needs are our priority.  As part of our continuing mission to provide you with exceptional heart care, we have created designated Provider Care Teams.  These Care Teams include your primary Cardiologist (physician) and Advanced Practice Providers (APPs -  Physician Assistants and Nurse Practitioners) who all work together to provide you with the care you need, when you need it.  We recommend signing up for the patient portal called "MyChart".  Sign up information is provided on this After Visit Summary.  MyChart is used to connect with patients for Virtual Visits (Telemedicine).  Patients are able to view lab/test results, encounter notes, upcoming appointments, etc.  Non-urgent messages can be sent to your provider as well.   To learn more about what you can do with MyChart, go to [employer].    Your next appointment:   3 month(s)  The format for your next appointment:   In Person  Provider:   [facility], MD    Other Instructions NA

## 2022-04-01 NOTE — Addendum Note (Signed)
Addended by: Baldo Ash D on: 04/01/2022 12:33 PM   Modules accepted: Orders

## 2022-04-01 NOTE — Telephone Encounter (Signed)
Pt positive for DVT. He will start Eliquis 10mg  BID for 7 days then Eliquis 5 mg BID. LVM for spouse. Will follow up tomorrow to make sure pt got the message and medications.

## 2022-04-01 NOTE — Progress Notes (Signed)
Cardiology Office Note:    Date:  04/01/2022   ID:  [name], DOB 12/05/1955, MRN [id number]  PCP:  Buckner Malta, MD  Cardiologist:  Gypsy Balsam, MD    Referring MD: Buckner Malta, MD   Chief Complaint  Patient presents with   Leg Swelling    Weighted 267 around Thanksgiving     History of Present Illness:    [name] is a 66 y.o. male with past medical history significant for advanced COPD pattern that he still continues to smoke, he is on oxygen long time, essential hypertension, borderline diabetes, dyslipidemia, he was referred to [place] initially because of diminished ejection fraction however echocardiogram repeated showed preserved left ventricle ejection fraction, because of his symptomatology he did have a stress test done which was abnormal, after that cardiac catheterization which showed completely occluded right coronary artery as well as occluded obtuse marginal branch, decision has been made to pursue medical therapy. He came to me today with chief complaint of swelling of lower extremities he said that been going on for few weeks.  He also described pain in the left calf.  Both legs are swollen symmetrically.  However he said usually left leg will swell more.  Denies having any chest pain tightness squeezing pressure burning chest.  He can do very little because of COPD shortness of breath as well as chronic back pain.  Past Medical History:  Diagnosis Date   Anxiety    went off medicines for anxiety 6 months ago ( 12/2010), pt. "stays nervous", per pt.    Arthritis    lumbar stenosis    Benign prostatic hyperplasia with lower urinary tract symptoms    Chronic pain syndrome    Chronic pansinusitis    Chronic pharyngitis    Compression fracture of thoracic vertebra (HCC)    T11,T12 and L1   Contusion of thorax, unspecified, initial encounter    COPD (chronic obstructive pulmonary disease) (HCC)    Cysts    groin area (on & off- for 4 +yrs.) for  which he uses Doxycyline on going, BID, Rx fr. Dr. Fatima Blank in [place], [place].,     Dependence on supplemental oxygen    Depression    Diabetes mellitus [facility])    denied DM2 history 11/24/19; A1c 6.1% 02/02/18 (pre-diabetes)   Drug induced constipation    Dysphagia    Finger infection    05/2011, L hand, index finger  - I&D in MD office, treated /w antibiotic.Marland KitchenMarland KitchenMarland Kitchen?name   GERD (gastroesophageal reflux disease)    Headache(784.0)    uses excedrin prn   History of colon polyps    History of normal resting EKG    2009, preop   Hypertension    Long term (current) use of opiate analgesic    Myocardial infarction [facility])    denied MI, but reported LVF mildly decreased in the past 11/24/19; LVEF 50-55% 2018 by echo, LVEF 48% on non-ischemic Dobutamine stress test 01/2015   Neuromuscular disorder (HCC)    pseudoarthrosis    Normal cardiac stress test    2005, done prior to back surgery   Other chronic nonsuppurative otitis media, bilateral    Shortness of breath    pt. reports related to pain    Sleep apnea    study- 2012[facility] , Dr. Blenda Nicely, [address]  , uses CPAP  qo night    Wears glasses     Past Surgical History:  Procedure Laterality Date   APPENDECTOMY  as a teen    BACK SURGERY     multiple, lumbar & cerv. fusions, last low back- 2009   COLONOSCOPY  06/06/2010   Colon polyps- status post polypectomy. Moderate predominantly sigmoid diverticulosis. Internal hemorrhoids.   ESOPHAGOGASTRODUODENOSCOPY  06/14/2008   Mild gastritis. Otherwise normal EGD.    HEMORROIDECTOMY     2012[facility]   KYPHOPLASTY N/A 11/27/2019   Procedure: THORACIC ELEVEN, THORACIC TWELVE, LUMBAR ONE KYPHOPLASTY;  Surgeon: Barnett Abu, MD;  Location: [facility] OR;  Service: Neurosurgery;  Laterality: N/A;   LEFT HEART CATH AND CORONARY ANGIOGRAPHY N/A 11/13/2021   Procedure: LEFT HEART CATH AND CORONARY ANGIOGRAPHY;  Surgeon: Corky Crafts, MD;  Location: [facility] INVASIVE CV LAB;   Service: Cardiovascular;  Laterality: N/A;   NECK SURGERY      Current Medications: Current Meds  Medication Sig   albuterol (PROVENTIL) (2.5 MG/3ML) 0.083% nebulizer solution Take 2.5 mg by nebulization every 6 (six) hours as needed for wheezing or shortness of breath.   albuterol (VENTOLIN HFA) 108 (90 Base) MCG/ACT inhaler Inhale 1-2 puffs into the lungs every 6 (six) hours as needed for wheezing or shortness of breath.   Aspirin-Acetaminophen-Caffeine (GOODYS EXTRA STRENGTH) 500-325-65 MG PACK Take 1-2 packets by mouth daily as needed (pain).   busPIRone (BUSPAR) 30 MG tablet Take 30 mg by mouth 3 (three) times daily.   cholecalciferol (VITAMIN D3) 25 MCG (1000 UNIT) tablet Take 1,000 Units by mouth daily.   diphenhydrAMINE (BENADRYL) 25 MG tablet Take 50 mg by mouth daily.   DULoxetine (CYMBALTA) 60 MG capsule Take 60 mg by mouth daily.   fluticasone (FLONASE) 50 MCG/ACT nasal spray Place 2 sprays into both nostrils daily as needed for allergies or rhinitis.   furosemide (LASIX) 20 MG tablet Take 20-40 mg by mouth daily.   gabapentin (NEURONTIN) 300 MG capsule Take 300 mg by mouth 3 (three) times daily.   levocetirizine (XYZAL) 5 MG tablet Take 5 mg by mouth every evening.   metoprolol succinate (TOPROL-XL) 50 MG 24 hr tablet Take 50 mg by mouth daily. Take with or immediately following a meal.   mupirocin cream (BACTROBAN) 2 % Apply 1 Application topically 2 (two) times daily as needed (wound care).   omeprazole (PRILOSEC) 40 MG capsule Take 40 mg by mouth at bedtime.   oxyCODONE (ROXICODONE) 15 MG immediate release tablet Take 15 mg by mouth 4 (four) times daily as needed for pain.   Tamsulosin HCl (FLOMAX) 0.4 MG CAPS Take 0.8 mg by mouth daily.   tiZANidine (ZANAFLEX) 4 MG capsule Take 4 mg by mouth 3 (three) times daily.   traZODone (DESYREL) 50 MG tablet Take 100 mg by mouth at bedtime. For sleep     Allergies:   Strawberry extract   Social History   Socioeconomic History    Marital status: Married    Spouse name: Not on file   Number of children: Not on file   Years of education: Not on file   Highest education level: Not on file  Occupational History   Not on file  Tobacco Use   Smoking status: Every Day    Packs/day: 1.00    Types: Cigarettes   Smokeless tobacco: Never  Vaping Use   Vaping Use: Never used  Substance and Sexual Activity   Alcohol use: Not Currently   Drug use: No   Sexual activity: Not on file  Other Topics Concern   Not on file  Social History Narrative   Not on  file   Social Determinants of Health   Financial Resource Strain: Not on file  Food Insecurity: Not on file  Transportation Needs: Not on file  Physical Activity: Not on file  Stress: Not on file  Social Connections: Not on file     Family History: The patient's family history includes Alcoholism in his father and mother; Coronary artery disease in his father; Hypertension in his father. There is no history of Anesthesia problems. ROS:   Please see the history of present illness.    All 14 point review of systems negative except as described per history of present illness  EKGs/Labs/Other Studies Reviewed:      Recent Labs: 11/07/2021: ALT 7; BUN 10; Creatinine, Ser 1.00; Potassium 5.1; Sodium 137 11/12/2021: Hemoglobin 15.0; Platelets 187  Recent Lipid Panel No results found for: "CHOL", "TRIG", "HDL", "CHOLHDL", "VLDL", "LDLCALC", "LDLDIRECT"  Physical Exam:    VS:  BP (!) 138/90 (BP Location: Left Arm, Patient Position: Sitting)   Pulse 76   Ht 5\' 7"  (1.702 m)   Wt 280 lb (127 kg)   SpO2 92%   BMI 43.85 kg/m     Wt Readings from Last 3 Encounters:  04/01/22 280 lb (127 kg)  11/13/21 270 lb (122.5 kg)  11/12/21 271 lb (122.9 kg)     GEN:  Well nourished, well developed in no acute distress HEENT: Normal NECK: No JVD; No carotid bruits LYMPHATICS: No lymphadenopathy CARDIAC: RRR, no murmurs, no rubs, no gallops RESPIRATORY: Bilateral rhonchi  or wheezes ABDOMEN: Soft, non-tender, non-distended MUSCULOSKELETAL:  No edema; No deformity  SKIN: Warm and dry LOWER EXTREMITIES: 1+ swelling both lower extremities with pain in the left calf NEUROLOGIC:  Alert and oriented x 3 PSYCHIATRIC:  Normal affect   ASSESSMENT:    1. Coronary artery disease of native artery of native heart with stable angina pectoris (HCC)   2. Dilated cardiomyopathy (HCC)   3. Primary hypertension   4. Centrilobular emphysema (HCC)    PLAN:    In order of problems listed above:  Swelling of lower extremities with left calf pain.  Have high level suspicion for potential DVT.  However, he tells me that has been going on since Thanksgiving meaning few weeks already.  I will ask him to have stat DVT study of lower extremities.  I will also increase dose of diuretic he is taking Lasix 20 we will go Lasix 40, last time his potassium was slightly on the higher side, therefore, I will not give him any potassium supplementation, will do Chem-7 today as well as proBNP. History of dilated cardiomyopathy.  He does have signs and symptoms of decompensated CHF but it could be because of advanced COPD also worried about potentially DVT study.  Will try to get echocardiogram if DVT study is negative. Essential hypertension blood pressure seems to well controlled continue present management. COPD: Advance noted of course he was told 1 more time to quit smoking but honestly I doubt he will be able to do it.  He told me many years ago he quit for about 2 months when he was recovering from surgical intervention in his back.  But very quickly he went back to smoking. Coronary artery disease with completely occluded RCA as well as obtuse marginal branch.  Medical therapy.  He is on antiplatelet therapy which I will continue.  I do not see him on statin I will check his complete metabolic panel today I also direct LDL and based on that decide  which medication to use to treat his  dyslipidemia   Medication Adjustments/Labs and Tests Ordered: Current medicines are reviewed at length with the patient today.  Concerns regarding medicines are outlined above.  No orders of the defined types were placed in this encounter.  Medication changes: No orders of the defined types were placed in this encounter.   Signed, Georgeanna Lea, MD, [facility] 04/01/2022 12:02 PM    [facility]

## 2022-04-02 LAB — CBC
Hematocrit: 44.3 % (ref 37.5–51.0)
Hemoglobin: 15.1 g/dL (ref 13.0–17.7)
MCH: 32.1 pg (ref 26.6–33.0)
MCHC: 34.1 g/dL (ref 31.5–35.7)
MCV: 94 fL (ref 79–97)
Platelets: 203 10*3/uL (ref 150–450)
RBC: 4.7 x10E6/uL (ref 4.14–5.80)
RDW: 13.7 % (ref 11.6–15.4)
WBC: 9.9 10*3/uL (ref 3.4–10.8)

## 2022-04-02 LAB — COMPREHENSIVE METABOLIC PANEL
ALT: 6 IU/L (ref 0–44)
AST: 10 IU/L (ref 0–40)
Albumin/Globulin Ratio: 1.3 (ref 1.2–2.2)
Albumin: 4.2 g/dL (ref 3.9–4.9)
Alkaline Phosphatase: 116 IU/L (ref 44–121)
BUN/Creatinine Ratio: 7 — ABNORMAL LOW (ref 10–24)
BUN: 8 mg/dL (ref 8–27)
Bilirubin Total: 0.4 mg/dL (ref 0.0–1.2)
CO2: 26 mmol/L (ref 20–29)
Calcium: 9.3 mg/dL (ref 8.6–10.2)
Chloride: 100 mmol/L (ref 96–106)
Creatinine, Ser: 1.07 mg/dL (ref 0.76–1.27)
Globulin, Total: 3.3 g/dL (ref 1.5–4.5)
Glucose: 89 mg/dL (ref 70–99)
Potassium: 5 mmol/L (ref 3.5–5.2)
Sodium: 141 mmol/L (ref 134–144)
Total Protein: 7.5 g/dL (ref 6.0–8.5)
eGFR: 77 mL/min/{1.73_m2} (ref >59)

## 2022-04-02 LAB — PRO B NATRIURETIC PEPTIDE: NT-Pro BNP: 102 pg/mL (ref 0–376)

## 2022-04-02 LAB — LDL CHOLESTEROL, DIRECT: LDL Direct: 86 mg/dL (ref 0–99)

## 2022-04-02 NOTE — Telephone Encounter (Signed)
Left another message to return my call.  

## 2022-04-02 NOTE — Telephone Encounter (Signed)
Spoke with Annice Pih notified of results and recommendation. She said pharmacy should have the medicine this afternoon. I offered to pick some samples but she said was unable to make it because of an appt she had. I expressed the importance of starting Eliquis and how he needs to start this asap to avoid further complications. She verbalized understanding. I advised, if the pharmacy does not have Eliquis by1 or 2 pm today, to let us know so we can make different arrangements to obtain his medicine. I explained the directions of use and she verbalize understanding and had no further questions.

## 2022-04-02 NOTE — Telephone Encounter (Signed)
Wife called back to say that she received the medication and patient is taking it. Please advise

## 2022-04-13 ENCOUNTER — Telehealth: Payer: Self-pay | Admitting: Cardiology

## 2022-04-13 DIAGNOSIS — I10 Essential (primary) hypertension: Secondary | ICD-10-CM

## 2022-04-13 NOTE — Telephone Encounter (Signed)
Spoke with wife and advised per Dr. Vanetta Shawl note that pt should come for a BMP before making any changes in his fluid medication.

## 2022-04-13 NOTE — Telephone Encounter (Signed)
LVM for pt regarding swelling.

## 2022-04-13 NOTE — Telephone Encounter (Signed)
Pt c/o swelling: STAT is pt has developed SOB within 24 hours  How much weight have you gained and in what time span? No   If swelling, where is the swelling located? Legs   Are you currently taking a fluid pill? Yes  Are you currently SOB? Yes, but due to COPD  Do you have a log of your daily weights (if so, list)? N/A   Have you gained 3 pounds in a day or 5 pounds in a week? No   Have you traveled recently? No   Pt's wife states that patient seems to have swelling in legs and would like to speak to someone in regards to this.

## 2022-04-13 NOTE — Addendum Note (Signed)
Addended by: Baldo Ash D on: 04/13/2022 02:53 PM   Modules accepted: Orders

## 2022-04-22 ENCOUNTER — Telehealth: Payer: Self-pay

## 2022-04-22 DIAGNOSIS — I10 Essential (primary) hypertension: Secondary | ICD-10-CM | POA: Diagnosis not present

## 2022-04-22 MED ORDER — APIXABAN 5 MG PO TABS: 5.0000 mg | ORAL_TABLET | Freq: Two times a day (BID) | ORAL | 11 refills | Status: DC

## 2022-04-22 NOTE — Telephone Encounter (Signed)
Pt came to office today for labs. While having his labs he reports that he is out of his Eliquis and it cannot be filled until 05/02/21. Pt thought he was confused on how to take. Confirmed pt done at least 1 week of 10 mg twice daily, he may have done longer as he is not sure. Wife confirmed that he done at least 1 week of 10 mg twice daily. Pt and his ewife verbalized

## 2022-04-23 LAB — BASIC METABOLIC PANEL
BUN/Creatinine Ratio: 7 — ABNORMAL LOW (ref 10–24)
BUN: 7 mg/dL — ABNORMAL LOW (ref 8–27)
CO2: 26 mmol/L (ref 20–29)
Calcium: 9 mg/dL (ref 8.6–10.2)
Chloride: 98 mmol/L (ref 96–106)
Creatinine, Ser: 1.07 mg/dL (ref 0.76–1.27)
Glucose: 85 mg/dL (ref 70–99)
Potassium: 5.1 mmol/L (ref 3.5–5.2)
Sodium: 139 mmol/L (ref 134–144)
eGFR: 77 mL/min/{1.73_m2} (ref >59)

## 2022-04-24 ENCOUNTER — Telehealth: Payer: Self-pay

## 2022-04-24 NOTE — Telephone Encounter (Signed)
Spoke with spouse about blood work and leg edema. She stated that the pts legs are better but still has swelling. He is taking Lasix 40mg  BID. Provided Eliquis 2.5mg  2 tablets twice daily until his Rx can be filled. Should he increase Lasix for leg swelling?

## 2022-04-28 ENCOUNTER — Telehealth: Payer: Self-pay

## 2022-04-28 DIAGNOSIS — I10 Essential (primary) hypertension: Secondary | ICD-10-CM

## 2022-04-28 NOTE — Telephone Encounter (Signed)
LVM returning call with Dr. Wendy Poet reply to her note. BMP ordered.

## 2022-04-30 DIAGNOSIS — G894 Chronic pain syndrome: Secondary | ICD-10-CM | POA: Diagnosis not present

## 2022-04-30 DIAGNOSIS — J449 Chronic obstructive pulmonary disease, unspecified: Secondary | ICD-10-CM | POA: Diagnosis not present

## 2022-04-30 DIAGNOSIS — M47812 Spondylosis without myelopathy or radiculopathy, cervical region: Secondary | ICD-10-CM | POA: Diagnosis not present

## 2022-04-30 DIAGNOSIS — M549 Dorsalgia, unspecified: Secondary | ICD-10-CM | POA: Diagnosis not present

## 2022-04-30 DIAGNOSIS — M47816 Spondylosis without myelopathy or radiculopathy, lumbar region: Secondary | ICD-10-CM | POA: Diagnosis not present

## 2022-04-30 DIAGNOSIS — Z1389 Encounter for screening for other disorder: Secondary | ICD-10-CM | POA: Diagnosis not present

## 2022-05-19 ENCOUNTER — Ambulatory Visit: Payer: Medicare Other | Admitting: Gastroenterology

## 2022-05-31 DIAGNOSIS — J449 Chronic obstructive pulmonary disease, unspecified: Secondary | ICD-10-CM | POA: Diagnosis not present

## 2022-06-01 DIAGNOSIS — G894 Chronic pain syndrome: Secondary | ICD-10-CM | POA: Diagnosis not present

## 2022-06-01 DIAGNOSIS — Z1389 Encounter for screening for other disorder: Secondary | ICD-10-CM | POA: Diagnosis not present

## 2022-06-01 DIAGNOSIS — M549 Dorsalgia, unspecified: Secondary | ICD-10-CM | POA: Diagnosis not present

## 2022-06-01 DIAGNOSIS — Z79891 Long term (current) use of opiate analgesic: Secondary | ICD-10-CM | POA: Diagnosis not present

## 2022-06-02 DIAGNOSIS — R070 Pain in throat: Secondary | ICD-10-CM | POA: Diagnosis not present

## 2022-06-02 DIAGNOSIS — R1310 Dysphagia, unspecified: Secondary | ICD-10-CM | POA: Diagnosis not present

## 2022-06-23 ENCOUNTER — Ambulatory Visit: Payer: Medicare Other | Admitting: Gastroenterology

## 2022-06-24 DIAGNOSIS — G894 Chronic pain syndrome: Secondary | ICD-10-CM | POA: Diagnosis not present

## 2022-06-24 DIAGNOSIS — M549 Dorsalgia, unspecified: Secondary | ICD-10-CM | POA: Diagnosis not present

## 2022-06-24 DIAGNOSIS — Z1389 Encounter for screening for other disorder: Secondary | ICD-10-CM | POA: Diagnosis not present

## 2022-06-29 DIAGNOSIS — J449 Chronic obstructive pulmonary disease, unspecified: Secondary | ICD-10-CM | POA: Diagnosis not present

## 2022-07-16 DIAGNOSIS — Z79899 Other long term (current) drug therapy: Secondary | ICD-10-CM | POA: Diagnosis not present

## 2022-07-16 DIAGNOSIS — J439 Emphysema, unspecified: Secondary | ICD-10-CM | POA: Diagnosis not present

## 2022-07-16 DIAGNOSIS — I110 Hypertensive heart disease with heart failure: Secondary | ICD-10-CM | POA: Diagnosis not present

## 2022-07-16 DIAGNOSIS — I43 Cardiomyopathy in diseases classified elsewhere: Secondary | ICD-10-CM | POA: Diagnosis not present

## 2022-07-16 DIAGNOSIS — Z13220 Encounter for screening for lipoid disorders: Secondary | ICD-10-CM | POA: Diagnosis not present

## 2022-07-16 DIAGNOSIS — F17200 Nicotine dependence, unspecified, uncomplicated: Secondary | ICD-10-CM | POA: Diagnosis not present

## 2022-07-16 DIAGNOSIS — R7302 Impaired glucose tolerance (oral): Secondary | ICD-10-CM | POA: Diagnosis not present

## 2022-07-21 DIAGNOSIS — Z1389 Encounter for screening for other disorder: Secondary | ICD-10-CM | POA: Diagnosis not present

## 2022-07-21 DIAGNOSIS — G894 Chronic pain syndrome: Secondary | ICD-10-CM | POA: Diagnosis not present

## 2022-07-21 DIAGNOSIS — M549 Dorsalgia, unspecified: Secondary | ICD-10-CM | POA: Diagnosis not present

## 2022-07-23 ENCOUNTER — Ambulatory Visit: Payer: Medicare Other | Admitting: Cardiology

## 2022-07-30 DIAGNOSIS — J449 Chronic obstructive pulmonary disease, unspecified: Secondary | ICD-10-CM | POA: Diagnosis not present

## 2022-08-18 DIAGNOSIS — Z1389 Encounter for screening for other disorder: Secondary | ICD-10-CM | POA: Diagnosis not present

## 2022-08-18 DIAGNOSIS — M47812 Spondylosis without myelopathy or radiculopathy, cervical region: Secondary | ICD-10-CM | POA: Diagnosis not present

## 2022-08-18 DIAGNOSIS — M47816 Spondylosis without myelopathy or radiculopathy, lumbar region: Secondary | ICD-10-CM | POA: Diagnosis not present

## 2022-08-18 DIAGNOSIS — G894 Chronic pain syndrome: Secondary | ICD-10-CM | POA: Diagnosis not present

## 2022-08-29 DIAGNOSIS — J449 Chronic obstructive pulmonary disease, unspecified: Secondary | ICD-10-CM | POA: Diagnosis not present

## 2022-09-17 DIAGNOSIS — G894 Chronic pain syndrome: Secondary | ICD-10-CM | POA: Diagnosis not present

## 2022-09-17 DIAGNOSIS — M549 Dorsalgia, unspecified: Secondary | ICD-10-CM | POA: Diagnosis not present

## 2022-09-17 DIAGNOSIS — Z1389 Encounter for screening for other disorder: Secondary | ICD-10-CM | POA: Diagnosis not present

## 2022-09-29 DIAGNOSIS — J449 Chronic obstructive pulmonary disease, unspecified: Secondary | ICD-10-CM | POA: Diagnosis not present

## 2022-10-02 ENCOUNTER — Ambulatory Visit: Payer: Medicare Other | Admitting: Cardiology

## 2022-10-19 DIAGNOSIS — R7303 Prediabetes: Secondary | ICD-10-CM | POA: Diagnosis not present

## 2022-10-19 DIAGNOSIS — I43 Cardiomyopathy in diseases classified elsewhere: Secondary | ICD-10-CM | POA: Diagnosis not present

## 2022-10-19 DIAGNOSIS — I110 Hypertensive heart disease with heart failure: Secondary | ICD-10-CM | POA: Diagnosis not present

## 2022-10-19 DIAGNOSIS — J439 Emphysema, unspecified: Secondary | ICD-10-CM | POA: Diagnosis not present

## 2022-10-20 DIAGNOSIS — Z79891 Long term (current) use of opiate analgesic: Secondary | ICD-10-CM | POA: Diagnosis not present

## 2022-10-20 DIAGNOSIS — M549 Dorsalgia, unspecified: Secondary | ICD-10-CM | POA: Diagnosis not present

## 2022-10-20 DIAGNOSIS — G894 Chronic pain syndrome: Secondary | ICD-10-CM | POA: Diagnosis not present

## 2022-10-20 DIAGNOSIS — M47812 Spondylosis without myelopathy or radiculopathy, cervical region: Secondary | ICD-10-CM | POA: Diagnosis not present

## 2022-10-20 DIAGNOSIS — Z1389 Encounter for screening for other disorder: Secondary | ICD-10-CM | POA: Diagnosis not present

## 2022-10-20 DIAGNOSIS — M47816 Spondylosis without myelopathy or radiculopathy, lumbar region: Secondary | ICD-10-CM | POA: Diagnosis not present

## 2022-10-29 DIAGNOSIS — J449 Chronic obstructive pulmonary disease, unspecified: Secondary | ICD-10-CM | POA: Diagnosis not present

## 2022-11-18 ENCOUNTER — Encounter: Payer: Self-pay | Admitting: Cardiology

## 2022-11-18 ENCOUNTER — Ambulatory Visit: Payer: Medicare Other | Attending: Cardiology | Admitting: Cardiology

## 2022-11-18 VITALS — BP 158/98 | HR 61 | Ht 66.0 in | Wt 268.0 lb

## 2022-11-18 DIAGNOSIS — I10 Essential (primary) hypertension: Secondary | ICD-10-CM | POA: Diagnosis not present

## 2022-11-18 DIAGNOSIS — I25118 Atherosclerotic heart disease of native coronary artery with other forms of angina pectoris: Secondary | ICD-10-CM

## 2022-11-18 DIAGNOSIS — I420 Dilated cardiomyopathy: Secondary | ICD-10-CM

## 2022-11-18 DIAGNOSIS — E785 Hyperlipidemia, unspecified: Secondary | ICD-10-CM | POA: Diagnosis not present

## 2022-11-18 MED ORDER — ROSUVASTATIN CALCIUM 20 MG PO TABS: 20.0000 mg | ORAL_TABLET | Freq: Every day | ORAL | 3 refills | Status: DC

## 2022-11-18 NOTE — Patient Instructions (Addendum)
Medication Instructions:   START: Crestor 20mg  1 tablet daily   Lab Work: Your physician recommends that you return for lab work in: 6 weeks You need to have labs done when you are fasting.  You can come Monday through Friday 8:30 am to 12:00 pm and 1:15 to 4:30. You do not need to make an appointment as the order has already been placed. The labs you are going to have done are AST, ALT, Lipids.    Testing/Procedures: Your physician has requested that you have an echocardiogram. Echocardiography is a painless test that uses sound waves to create images of your heart. It provides your doctor with information about the size and shape of your heart and how well your heart's chambers and valves are working. This procedure takes approximately one hour. There are no restrictions for this procedure. Please do NOT wear cologne, perfume, aftershave, or lotions (deodorant is allowed). Please arrive 15 minutes prior to your appointment time.    Follow-Up: At [facility], you and your health needs are our priority.  As part of our continuing mission to provide you with exceptional heart care, we have created designated Provider Care Teams.  These Care Teams include your primary Cardiologist (physician) and Advanced Practice Providers (APPs -  Physician Assistants and Nurse Practitioners) who all work together to provide you with the care you need, when you need it.  We recommend signing up for the patient portal called "MyChart".  Sign up information is provided on this After Visit Summary.  MyChart is used to connect with patients for Virtual Visits (Telemedicine).  Patients are able to view lab/test results, encounter notes, upcoming appointments, etc.  Non-urgent messages can be sent to your provider as well.   To learn more about what you can do with MyChart, go to [email or web address].    Your next appointment:   6 month(s)  The format for your next appointment:   In Person  Provider:    Gypsy Balsam, MD    Other Instructions NA

## 2022-11-18 NOTE — Progress Notes (Signed)
Cardiology Office Note:    Date:  11/18/2022   ID:  Si Raider, DOB 1955/12/23, MRN [id number]  PCP:  Buckner Malta, MD  Cardiologist:  Gypsy Balsam, MD    Referring MD: Buckner Malta, MD   Chief Complaint  Patient presents with   Medication Management    Med side effect    History of Present Illness:    [name] is a 67 y.o. male  with past medical history significant for advanced COPD pattern that he still continues to smoke, he is on oxygen long time, essential hypertension, borderline diabetes, dyslipidemia, he was referred to [place] initially because of diminished ejection fraction however echocardiogram repeated showed preserved left ventricle ejection fraction, because of his symptomatology he did have a stress test done which was abnormal, after that cardiac catheterization which showed completely occluded right coronary artery as well as occluded obtuse marginal branch, decision has been made to pursue medical therapy.  Also when I seen him last time DVT study has been done which showed positive and he is being anticoagulated since that time. Comes today to months for follow-up overall doing fair, she is on oxygen COPD but resting a lot, sadly he still continues to smoke.  He stopped Imdur and stop atorvastatin because it made him drowsy.  He lives very sedentary lifestyle and again he sits all the time.  Past Medical History:  Diagnosis Date   Anxiety    went off medicines for anxiety 6 months ago ( 12/2010), pt. "stays nervous", per pt.    Arthritis    lumbar stenosis    Benign prostatic hyperplasia with lower urinary tract symptoms    Chronic pain syndrome    Chronic pansinusitis    Chronic pharyngitis    Compression fracture of thoracic vertebra (HCC)    T11,T12 and L1   Contusion of thorax, unspecified, initial encounter    COPD (chronic obstructive pulmonary disease) (HCC)    Cysts    groin area (on & off- for 4 +yrs.) for which he uses Doxycyline  on going, BID, Rx fr. Dr. Fatima Blank in [place], [place].,     Dependence on supplemental oxygen    Depression    Diabetes mellitus [facility])    denied DM2 history 11/24/19; A1c 6.1% 02/02/18 (pre-diabetes)   Drug induced constipation    Dysphagia    Finger infection    05/2011, L hand, index finger  - I&D in MD office, treated /w antibiotic.Marland KitchenMarland KitchenMarland Kitchen?name   GERD (gastroesophageal reflux disease)    Headache(784.0)    uses excedrin prn   History of colon polyps    History of normal resting EKG    2009, preop   Hypertension    Long term (current) use of opiate analgesic    Myocardial infarction [facility])    denied MI, but reported LVF mildly decreased in the past 11/24/19; LVEF 50-55% 2018 by echo, LVEF 48% on non-ischemic Dobutamine stress test 01/2015   Neuromuscular disorder (HCC)    pseudoarthrosis    Normal cardiac stress test    2005, done prior to back surgery   Other chronic nonsuppurative otitis media, bilateral    Shortness of breath    pt. reports related to pain    Sleep apnea    study- 2012[facility] , Dr. Blenda Nicely, [address]  , uses CPAP  qo night    Wears glasses     Past Surgical History:  Procedure Laterality Date   APPENDECTOMY     as a  teen    BACK SURGERY     multiple, lumbar & cerv. fusions, last low back- 2009   COLONOSCOPY  06/06/2010   Colon polyps- status post polypectomy. Moderate predominantly sigmoid diverticulosis. Internal hemorrhoids.   ESOPHAGOGASTRODUODENOSCOPY  06/14/2008   Mild gastritis. Otherwise normal EGD.    HEMORROIDECTOMY     2012[facility]   KYPHOPLASTY N/A 11/27/2019   Procedure: THORACIC ELEVEN, THORACIC TWELVE, LUMBAR ONE KYPHOPLASTY;  Surgeon: Barnett Abu, MD;  Location: [facility] OR;  Service: Neurosurgery;  Laterality: N/A;   LEFT HEART CATH AND CORONARY ANGIOGRAPHY N/A 11/13/2021   Procedure: LEFT HEART CATH AND CORONARY ANGIOGRAPHY;  Surgeon: Corky Crafts, MD;  Location: [facility] INVASIVE CV LAB;  Service: Cardiovascular;   Laterality: N/A;   NECK SURGERY      Current Medications: Current Meds  Medication Sig   albuterol (PROVENTIL) (2.5 MG/3ML) 0.083% nebulizer solution Take 2.5 mg by nebulization every 6 (six) hours as needed for wheezing or shortness of breath.   albuterol (VENTOLIN HFA) 108 (90 Base) MCG/ACT inhaler Inhale 1-2 puffs into the lungs every 6 (six) hours as needed for wheezing or shortness of breath.   apixaban (ELIQUIS) 5 MG TABS tablet Take 1 tablet (5 mg total) by mouth 2 (two) times daily.   Aspirin-Acetaminophen-Caffeine (GOODYS EXTRA STRENGTH) 500-325-65 MG PACK Take 1-2 packets by mouth daily as needed (pain).   busPIRone (BUSPAR) 30 MG tablet Take 30 mg by mouth 3 (three) times daily.   cholecalciferol (VITAMIN D3) 25 MCG (1000 UNIT) tablet Take 1,000 Units by mouth daily.   diphenhydrAMINE (BENADRYL) 25 MG tablet Take 50 mg by mouth daily.   DULoxetine (CYMBALTA) 60 MG capsule Take 60 mg by mouth daily.   fluticasone (FLONASE) 50 MCG/ACT nasal spray Place 2 sprays into both nostrils daily as needed for allergies or rhinitis.   furosemide (LASIX) 20 MG tablet Take 20-40 mg by mouth daily.   gabapentin (NEURONTIN) 300 MG capsule Take 300 mg by mouth 3 (three) times daily.   levocetirizine (XYZAL) 5 MG tablet Take 5 mg by mouth every evening.   metoprolol succinate (TOPROL-XL) 50 MG 24 hr tablet Take 50 mg by mouth daily. Take with or immediately following a meal.   mupirocin cream (BACTROBAN) 2 % Apply 1 Application topically 2 (two) times daily as needed (wound care).   omeprazole (PRILOSEC) 40 MG capsule Take 40 mg by mouth at bedtime.   oxyCODONE (ROXICODONE) 15 MG immediate release tablet Take 15 mg by mouth 4 (four) times daily as needed for pain.   rosuvastatin (CRESTOR) 20 MG tablet Take 1 tablet (20 mg total) by mouth daily.   Tamsulosin HCl (FLOMAX) 0.4 MG CAPS Take 0.8 mg by mouth daily.   tiZANidine (ZANAFLEX) 4 MG capsule Take 4 mg by mouth 3 (three) times daily.   traZODone  (DESYREL) 50 MG tablet Take 100 mg by mouth at bedtime. For sleep     Allergies:   Strawberry extract   Social History   Socioeconomic History   Marital status: Married    Spouse name: Not on file   Number of children: Not on file   Years of education: Not on file   Highest education level: Not on file  Occupational History   Not on file  Tobacco Use   Smoking status: Every Day    Current packs/day: 1.00    Types: Cigarettes   Smokeless tobacco: Never  Vaping Use   Vaping status: Never Used  Substance and Sexual Activity  Alcohol use: Not Currently   Drug use: No   Sexual activity: Not on file  Other Topics Concern   Not on file  Social History Narrative   Not on file   Social Determinants of Health   Financial Resource Strain: Not on file  Food Insecurity: Not on file  Transportation Needs: Not on file  Physical Activity: Not on file  Stress: Not on file  Social Connections: Not on file     Family History: The patient's family history includes Alcoholism in his father and mother; Coronary artery disease in his father; Hypertension in his father. There is no history of Anesthesia problems. ROS:   Please see the history of present illness.    All 14 point review of systems negative except as described per history of present illness  EKGs/Labs/Other Studies Reviewed:         Recent Labs: 04/01/2022: ALT 6; Hemoglobin 15.1; NT-Pro BNP 102; Platelets 203 04/22/2022: BUN 7; Creatinine, Ser 1.07; Potassium 5.1; Sodium 139  Recent Lipid Panel    Component Value Date/Time   LDLDIRECT 86 04/01/2022 1313    Physical Exam:    VS:  BP (!) 158/98 (BP Location: Left Arm, Patient Position: Sitting)   Pulse 61   Ht 5\' 6"  (1.676 m)   Wt 268 lb (121.6 kg)   SpO2 90%   BMI 43.26 kg/m     Wt Readings from Last 3 Encounters:  11/18/22 268 lb (121.6 kg)  04/01/22 280 lb (127 kg)  11/13/21 270 lb (122.5 kg)     GEN:  Well nourished, well developed in no acute  distress HEENT: Normal NECK: No JVD; No carotid bruits LYMPHATICS: No lymphadenopathy CARDIAC: RRR, no murmurs, no rubs, no gallops RESPIRATORY:  Clear to auscultation without rales, wheezing or rhonchi  ABDOMEN: Soft, non-tender, non-distended MUSCULOSKELETAL:  No edema; No deformity  SKIN: Warm and dry LOWER EXTREMITIES: no swelling NEUROLOGIC:  Alert and oriented x 3 PSYCHIATRIC:  Normal affect   ASSESSMENT:    1. Dilated cardiomyopathy (HCC) normalization of left ventricle ejection fraction in summer 2023   2. Dyslipidemia   3. Coronary artery disease of native artery of native heart with stable angina pectoris (HCC)   4. Primary hypertension    PLAN:    In order of problems listed above:  Coronary artery disease.  He does have occluded 2 arteries.  Likely asymptomatic but his ability to exercise is very limited because of advanced lung disease.  Will continue present management. Dyslipidemia he stopped his Lipitor because of some strange side effects however he accepted my offer to try different statin.  I will put him on Crestor 20 see if he can tolerate it.  Fasting lipid profile will be done in 6 weeks. Dilated cardiomyopathy.  Will repeat his echocardiogram last echocardiogram showed preserved ejection fraction. History of DVT.  He is on Eliquis 5 mg daily which I will continue. Smoking and COPD obviously huge problem I start talking to him about quitting smoking he told me right away that he is not going to do it and he is not interested in conversation about that.   Medication Adjustments/Labs and Tests Ordered: Current medicines are reviewed at length with the patient today.  Concerns regarding medicines are outlined above.  Orders Placed This Encounter  Procedures   ALT   AST   Lipid panel   ECHOCARDIOGRAM COMPLETE   Medication changes:  Meds ordered this encounter  Medications   rosuvastatin (CRESTOR) 20 MG tablet  Sig: Take 1 tablet (20 mg total) by mouth  daily.    Dispense:  90 tablet    Refill:  3    Signed, Georgeanna Lea, MD, [facility] 11/18/2022 11:45 AM    [facility]

## 2022-11-24 DIAGNOSIS — M47816 Spondylosis without myelopathy or radiculopathy, lumbar region: Secondary | ICD-10-CM | POA: Diagnosis not present

## 2022-11-24 DIAGNOSIS — Z1389 Encounter for screening for other disorder: Secondary | ICD-10-CM | POA: Diagnosis not present

## 2022-11-24 DIAGNOSIS — G894 Chronic pain syndrome: Secondary | ICD-10-CM | POA: Diagnosis not present

## 2022-11-24 DIAGNOSIS — M47812 Spondylosis without myelopathy or radiculopathy, cervical region: Secondary | ICD-10-CM | POA: Diagnosis not present

## 2022-11-29 DIAGNOSIS — J449 Chronic obstructive pulmonary disease, unspecified: Secondary | ICD-10-CM | POA: Diagnosis not present

## 2022-12-15 ENCOUNTER — Ambulatory Visit: Payer: Medicare Other

## 2022-12-15 DIAGNOSIS — I420 Dilated cardiomyopathy: Secondary | ICD-10-CM

## 2022-12-15 DIAGNOSIS — E785 Hyperlipidemia, unspecified: Secondary | ICD-10-CM | POA: Diagnosis not present

## 2022-12-15 LAB — ECHOCARDIOGRAM COMPLETE: S' Lateral: 3.7 cm

## 2022-12-15 MED ORDER — PERFLUTREN LIPID MICROSPHERE
1.0000 mL | INTRAVENOUS | Status: AC | PRN
Start: 2022-12-15 — End: 2022-12-15
  Administered 2022-12-15: 8 mL via INTRAVENOUS

## 2022-12-17 ENCOUNTER — Telehealth: Payer: Self-pay

## 2022-12-17 NOTE — Telephone Encounter (Signed)
Spoke with spouse Annice Pih per DPR per Dr. Vanetta Shawl note regarding cholesterol results & Echo results. She verbalized understanding and had no further questions. Routed to PCP.

## 2022-12-22 ENCOUNTER — Telehealth: Payer: Self-pay | Admitting: Cardiology

## 2022-12-22 DIAGNOSIS — M47812 Spondylosis without myelopathy or radiculopathy, cervical region: Secondary | ICD-10-CM | POA: Diagnosis not present

## 2022-12-22 DIAGNOSIS — M47816 Spondylosis without myelopathy or radiculopathy, lumbar region: Secondary | ICD-10-CM | POA: Diagnosis not present

## 2022-12-22 DIAGNOSIS — Z1389 Encounter for screening for other disorder: Secondary | ICD-10-CM | POA: Diagnosis not present

## 2022-12-22 DIAGNOSIS — G894 Chronic pain syndrome: Secondary | ICD-10-CM | POA: Diagnosis not present

## 2022-12-22 NOTE — Telephone Encounter (Signed)
Pt c/o medication issue:  1. Name of Medication: apixaban (ELIQUIS) 5 MG TABS tablet   2. How are you currently taking this medication (dosage and times per day)? As written    3. Are you having a reaction (difficulty breathing--STAT)? No   4. What is your medication issue? Pt spouse called in stating this medication is $149 a month and they are unable to afford. Please advise.

## 2022-12-22 NOTE — Telephone Encounter (Signed)
Pts spouse called stating pt was not able to afford Eliquis. Advised to call Eliquis [phone] to see if they can get assistance.

## 2022-12-30 DIAGNOSIS — J449 Chronic obstructive pulmonary disease, unspecified: Secondary | ICD-10-CM | POA: Diagnosis not present

## 2023-01-13 ENCOUNTER — Other Ambulatory Visit: Payer: Self-pay | Admitting: Cardiology

## 2023-01-14 ENCOUNTER — Telehealth: Payer: Self-pay | Admitting: Cardiology

## 2023-01-14 NOTE — Telephone Encounter (Signed)
Eliquis samples provide for pt per pt request

## 2023-01-14 NOTE — Telephone Encounter (Signed)
Pt c/o medication issue:  1. Name of Medication: apixaban (ELIQUIS) 5 MG TABS tablet   2. How are you currently taking this medication (dosage and times per day)?   3. Are you having a reaction (difficulty breathing--STAT)?   4. What is your medication issue? Patient is requesting call back to discuss options for this medication due to pricing. She states that they can not currently afford the price of this medication.

## 2023-01-14 NOTE — Telephone Encounter (Signed)
LVM to call regarding message.

## 2023-01-14 NOTE — Telephone Encounter (Signed)
Pt c/o medication issue:  1. Name of Medication:   apixaban (ELIQUIS) 5 MG TABS tablet    2. How are you currently taking this medication (dosage and times per day)?  Take 1 tablet (5 mg total) by mouth 2 (two) times daily.       3. Are you having a reaction (difficulty breathing--STAT)? No  4. What is your medication issue? Pt's spouse is requesting a callback regarding this medication being too expensive now and they'd like to discuss other options. Please advise

## 2023-01-20 DIAGNOSIS — Z1389 Encounter for screening for other disorder: Secondary | ICD-10-CM | POA: Diagnosis not present

## 2023-01-20 DIAGNOSIS — M549 Dorsalgia, unspecified: Secondary | ICD-10-CM | POA: Diagnosis not present

## 2023-01-20 DIAGNOSIS — G894 Chronic pain syndrome: Secondary | ICD-10-CM | POA: Diagnosis not present

## 2023-01-29 DIAGNOSIS — J449 Chronic obstructive pulmonary disease, unspecified: Secondary | ICD-10-CM | POA: Diagnosis not present

## 2023-02-16 DIAGNOSIS — B351 Tinea unguium: Secondary | ICD-10-CM | POA: Diagnosis not present

## 2023-02-16 DIAGNOSIS — Z136 Encounter for screening for cardiovascular disorders: Secondary | ICD-10-CM | POA: Diagnosis not present

## 2023-02-16 DIAGNOSIS — Z79899 Other long term (current) drug therapy: Secondary | ICD-10-CM | POA: Diagnosis not present

## 2023-02-16 DIAGNOSIS — R195 Other fecal abnormalities: Secondary | ICD-10-CM | POA: Diagnosis not present

## 2023-02-16 DIAGNOSIS — M79675 Pain in left toe(s): Secondary | ICD-10-CM | POA: Diagnosis not present

## 2023-02-16 DIAGNOSIS — M79674 Pain in right toe(s): Secondary | ICD-10-CM | POA: Diagnosis not present

## 2023-02-16 DIAGNOSIS — Z0000 Encounter for general adult medical examination without abnormal findings: Secondary | ICD-10-CM | POA: Diagnosis not present

## 2023-02-16 DIAGNOSIS — L02416 Cutaneous abscess of left lower limb: Secondary | ICD-10-CM | POA: Diagnosis not present

## 2023-02-16 DIAGNOSIS — Z23 Encounter for immunization: Secondary | ICD-10-CM | POA: Diagnosis not present

## 2023-02-16 DIAGNOSIS — E1165 Type 2 diabetes mellitus with hyperglycemia: Secondary | ICD-10-CM | POA: Diagnosis not present

## 2023-02-18 ENCOUNTER — Encounter: Payer: Self-pay | Admitting: Family Medicine

## 2023-02-19 ENCOUNTER — Other Ambulatory Visit: Payer: Self-pay | Admitting: Family Medicine

## 2023-02-19 DIAGNOSIS — Z136 Encounter for screening for cardiovascular disorders: Secondary | ICD-10-CM

## 2023-02-22 ENCOUNTER — Other Ambulatory Visit: Payer: Self-pay | Admitting: Family Medicine

## 2023-02-22 DIAGNOSIS — Z136 Encounter for screening for cardiovascular disorders: Secondary | ICD-10-CM

## 2023-02-22 DIAGNOSIS — Z1389 Encounter for screening for other disorder: Secondary | ICD-10-CM | POA: Diagnosis not present

## 2023-02-22 DIAGNOSIS — M47816 Spondylosis without myelopathy or radiculopathy, lumbar region: Secondary | ICD-10-CM | POA: Diagnosis not present

## 2023-02-22 DIAGNOSIS — M47812 Spondylosis without myelopathy or radiculopathy, cervical region: Secondary | ICD-10-CM | POA: Diagnosis not present

## 2023-02-22 DIAGNOSIS — G894 Chronic pain syndrome: Secondary | ICD-10-CM | POA: Diagnosis not present

## 2023-03-01 DIAGNOSIS — J449 Chronic obstructive pulmonary disease, unspecified: Secondary | ICD-10-CM | POA: Diagnosis not present

## 2023-03-03 ENCOUNTER — Ambulatory Visit: Payer: Medicare Other | Admitting: Podiatry

## 2023-03-04 ENCOUNTER — Ambulatory Visit: Payer: Medicare Other

## 2023-03-09 ENCOUNTER — Ambulatory Visit
Admission: RE | Admit: 2023-03-09 | Discharge: 2023-03-09 | Disposition: A | Discharge status: Home / Self Care | Payer: Medicare Other | Source: Ambulatory Visit | Attending: Family Medicine | Admitting: Family Medicine

## 2023-03-09 DIAGNOSIS — I219 Acute myocardial infarction, unspecified: Secondary | ICD-10-CM | POA: Diagnosis not present

## 2023-03-09 DIAGNOSIS — Z136 Encounter for screening for cardiovascular disorders: Secondary | ICD-10-CM | POA: Diagnosis not present

## 2023-03-09 DIAGNOSIS — I10 Essential (primary) hypertension: Secondary | ICD-10-CM | POA: Diagnosis not present

## 2023-03-09 DIAGNOSIS — E119 Type 2 diabetes mellitus without complications: Secondary | ICD-10-CM | POA: Diagnosis not present

## 2023-03-09 DIAGNOSIS — K219 Gastro-esophageal reflux disease without esophagitis: Secondary | ICD-10-CM | POA: Diagnosis not present

## 2023-03-31 DIAGNOSIS — J449 Chronic obstructive pulmonary disease, unspecified: Secondary | ICD-10-CM | POA: Diagnosis not present

## 2023-04-02 DIAGNOSIS — M47816 Spondylosis without myelopathy or radiculopathy, lumbar region: Secondary | ICD-10-CM | POA: Diagnosis not present

## 2023-04-02 DIAGNOSIS — Z1389 Encounter for screening for other disorder: Secondary | ICD-10-CM | POA: Diagnosis not present

## 2023-04-02 DIAGNOSIS — G894 Chronic pain syndrome: Secondary | ICD-10-CM | POA: Diagnosis not present

## 2023-04-02 DIAGNOSIS — M47812 Spondylosis without myelopathy or radiculopathy, cervical region: Secondary | ICD-10-CM | POA: Diagnosis not present

## 2023-04-22 ENCOUNTER — Telehealth: Payer: Self-pay | Admitting: Cardiology

## 2023-04-22 NOTE — Telephone Encounter (Signed)
Follow Up:     Wife is calling to see if clearance was received today. She said patient need to have surgery on 04-27-23. She said if he is not cleared before 04-27-23, ikt will cost him so much money to have this surgery.

## 2023-04-22 NOTE — Telephone Encounter (Signed)
   Name: [name]  DOB: 01-29-1956  MRN: [id number]  Primary Cardiologist: None  Preoperative team, please contact this patient and set up a phone call appointment for further preoperative risk assessment. Please obtain consent and complete medication review. Thank you for your help.  I confirm that guidance regarding antiplatelet and oral anticoagulation therapy has been completed and, if necessary, noted below.  Per office protocol, patient can hold Eliquis for 2 days prior to procedure.    I also confirmed the patient resides in the state [name]. As per [facility] Medical Board telemedicine laws, the patient must reside in the state in which the provider is licensed.  Rip Harbour, NP 04/22/2023, 4:17 PM [facility]

## 2023-04-22 NOTE — Telephone Encounter (Signed)
I s/w the pt's wife and informed her that we have not yet received any clearance request for the pt. I asked who was he having a procedure with. Pt's wife answered [facility]. I gave her the fax # [phone] attn: preop team.

## 2023-04-22 NOTE — Telephone Encounter (Signed)
Patient with diagnosis of DVT on Eliquis for anticoagulation.    Procedure: Dental Extraction - Amount of Teeth to be Pulled:  11 SURGICAL EXTRACTIONS   Date of procedure: 04/27/23  CrCl 80 ml.min using adjusted body weight Platelet count overdue  Patient does not require pre-op antibiotics for dental procedure.  Per office protocol, patient can hold Eliquis for 2 days prior to procedure.    **This guidance is not considered finalized until pre-operative APP has relayed final recommendations.**

## 2023-04-22 NOTE — Telephone Encounter (Signed)
   Pre-operative Risk Assessment    Patient Name: [name]  DOB: [birth year] MRN: [id number]  DATE OF LAST VISIT: 11/18/22 DR. KRASOWSKI DATE OF NEXT VISIT: 06/17/23 DR. KRASOWSKI  PER DENTAL CLEARANCE FORM THE PT'S BP TODAY WAS 196/119   Request for Surgical Clearance    Procedure:  Dental Extraction - Amount of Teeth to be Pulled:  11 SURGICAL EXTRACTIONS  Date of Surgery:  Clearance 04/27/23                                 Surgeon:  DR. Suzy Bouchard, DDS Surgeon's Group or Practice Name:  [facility]  Phone number:  [phone] Fax number:  [phone]   Type of Clearance Requested:   - Medical  - Pharmacy:  Hold Apixaban (Eliquis)     Type of Anesthesia:  Local    Additional requests/questions:    [name]   04/22/2023, 2:11 PM

## 2023-04-22 NOTE — Telephone Encounter (Signed)
Left message for the pt that he is going to need to be seen in office for preop clearance per preop APP due to notes from the dental office pt's BP was 196/119. Left message that I have tentatively scheduled with Dr. Bing Matter 04/26/23 per preop APP. Pt needs to call back and confirm appt.    I will also send notes as FYI to the dental office . The dental office did inform me when I s/w them earlier today they did let the pt know he may need to reschedule dental procedure depending what cardiology says.

## 2023-04-22 NOTE — Telephone Encounter (Signed)
   Name: [name]  DOB: November 10, 1955  MRN: [id number]  Primary Cardiologist: None  Chart reviewed as part of pre-operative protocol coverage. Because of [name]'s past medical history and time since last visit, he will require a follow-up in-office visit in order to better assess preoperative cardiovascular risk.  Pre-op covering staff: - Please schedule appointment and call patient to inform them. If patient already had an upcoming appointment within acceptable timeframe, please add "pre-op clearance" to the appointment notes so provider is aware. - Please contact requesting surgeon's office via preferred method (i.e, phone, fax) to inform them of need for appointment prior to surgery.  Preop team please notify patient that per office protocol he can hold Eliquis for 2 days prior to procedure, please resume when safe to do so from a bleeding standpoint.  Rip Harbour, NP  04/22/2023, 5:24 PM

## 2023-04-23 ENCOUNTER — Telehealth: Payer: Self-pay

## 2023-04-23 ENCOUNTER — Encounter: Payer: Self-pay | Admitting: Cardiology

## 2023-04-23 NOTE — Telephone Encounter (Signed)
LVM for pts spouse to call regarding message about his elevated BP.

## 2023-04-23 NOTE — Telephone Encounter (Signed)
Spouse called call center this morning stating that she wanted the pt worked in today because the dentist said his BP was elevated ad he need to be seen before December 31. He has an appointment on 12-30 at 3pm with Dr. Bing Matter and she stated that she is aware but wanted him seen today. Call center sent for advice.   This message is from Pre Op 04-22-23  "Left message for the pt that he is going to need to be seen in office for preop clearance per preop APP due to notes from the dental office pt's BP was 196/119. Left message that I have tentatively scheduled with Dr. Bing Matter 04/26/23 per preop APP. Pt needs to call back and confirm appt.      I will also send notes as FYI to the dental office . The dental office did inform me when I s/w them earlier today they did let the pt know he may need to reschedule dental procedure depending what cardiology says"

## 2023-04-26 ENCOUNTER — Ambulatory Visit: Payer: Medicare Other | Attending: Cardiology | Admitting: Cardiology

## 2023-04-26 ENCOUNTER — Encounter: Payer: Self-pay | Admitting: Cardiology

## 2023-04-26 VITALS — BP 158/88 | HR 88 | Ht 67.0 in | Wt 269.0 lb

## 2023-04-26 DIAGNOSIS — I10 Essential (primary) hypertension: Secondary | ICD-10-CM

## 2023-04-26 DIAGNOSIS — I25118 Atherosclerotic heart disease of native coronary artery with other forms of angina pectoris: Secondary | ICD-10-CM | POA: Diagnosis not present

## 2023-04-26 DIAGNOSIS — Z01810 Encounter for preprocedural cardiovascular examination: Secondary | ICD-10-CM | POA: Diagnosis not present

## 2023-04-26 DIAGNOSIS — J432 Centrilobular emphysema: Secondary | ICD-10-CM | POA: Diagnosis not present

## 2023-04-26 DIAGNOSIS — I420 Dilated cardiomyopathy: Secondary | ICD-10-CM | POA: Diagnosis not present

## 2023-04-26 MED ORDER — ATORVASTATIN CALCIUM 40 MG PO TABS: 40.0000 mg | ORAL_TABLET | Freq: Every day | ORAL | 3 refills | Status: AC

## 2023-04-26 NOTE — Progress Notes (Signed)
Cardiology Office Note:    Date:  04/26/2023   ID:  [name], DOB 01/09/1956, MRN [id number]  PCP:  Jesse Malta, MD  Cardiologist:  Jesse Balsam, MD    Referring MD: Jesse Malta, MD   Chief Complaint  Patient presents with   Hypertension    History of Present Illness:    [name] is a 67 y.o. male past medical history significant for advanced COPD send he still continues to smoke.  He is on oxygen all the time.  Essential hypertension, borderline diabetes, dyslipidemia initially referred to [place] because diminished left ventricle ejection fraction after that stress test was done which was abnormal and after that cardiac catheterization done which showed completely occluded right coronary artery as well as occluded obtuse marginal branch.  Decision at that time was made to perform and pursue medical therapy.  Also he has history of DVT and he is being anticoagulated.  Comes today to months for follow-up overall cardiac wise seems to be doing well denies have any chest pain tightness squeezing pressure burning chest shortness of breath is a problem he spent majority of time sitting in the chair.  He was sent to [place] today because he is supposed to have extraction of 11 teeth he is not sure exactly how is going to be done what kind of anesthesia going to be used.  He thinks going to be general esthesia however it will be done in the office he is supposed to hold his Eliquis but he did not.  He is scheduled to have it done tomorrow  Past Medical History:  Diagnosis Date   Anxiety    went off medicines for anxiety 6 months ago ( 12/2010), pt. "stays nervous", per pt.    Arthritis    lumbar stenosis    Benign prostatic hyperplasia with lower urinary tract symptoms    Chronic pain syndrome    Chronic pansinusitis    Chronic pharyngitis    Compression fracture of thoracic vertebra (HCC)    T11,T12 and L1   Contusion of thorax, unspecified, initial encounter    COPD  (chronic obstructive pulmonary disease) (HCC)    Coronary artery disease completely occluded RCA, completely occluded obtuse marginal with collateralization based on cardiac cath from 2023 11/07/2021   Cysts    groin area (on & off- for 4 +yrs.) for which he uses Doxycyline on going, BID, Rx fr. [name] in [place], [place].,     Dependence on supplemental oxygen    Depression    Diabetes mellitus [facility])    denied DM2 history 11/24/19; A1c 6.1% 02/02/18 (pre-diabetes)   Dilated cardiomyopathy (HCC) normalization of left ventricle ejection fraction in summer 2023 05/06/2021   Drug induced constipation    Dysphagia    Finger infection    05/2011, L hand, index finger  - I&D in MD office, treated /w antibiotic.Marland KitchenMarland KitchenMarland Kitchen?name   GERD (gastroesophageal reflux disease)    Headache(784.0)    uses excedrin prn   History of colon polyps    History of normal resting EKG    2009, preop   Hypertension    Long term (current) use of opiate analgesic    Myocardial infarction [facility])    denied MI, but reported LVF mildly decreased in the past 11/24/19; LVEF 50-55% 2018 by echo, LVEF 48% on non-ischemic Dobutamine stress test 01/2015   Neuromuscular disorder (HCC)    pseudoarthrosis    Normal cardiac stress test    2005, done prior to back  surgery   Other chronic nonsuppurative otitis media, bilateral    Shortness of breath    pt. reports related to pain    Sleep apnea    study- 2012[facility] , Dr. Blenda Nicely, [address]  , uses CPAP  qo night    Wears glasses     Past Surgical History:  Procedure Laterality Date   APPENDECTOMY     as a teen    BACK SURGERY     multiple, lumbar & cerv. fusions, last low back- 2009   COLONOSCOPY  06/06/2010   Colon polyps- status post polypectomy. Moderate predominantly sigmoid diverticulosis. Internal hemorrhoids.   ESOPHAGOGASTRODUODENOSCOPY  06/14/2008   Mild gastritis. Otherwise normal EGD.    HEMORROIDECTOMY     2012[facility]   KYPHOPLASTY  N/A 11/27/2019   Procedure: THORACIC ELEVEN, THORACIC TWELVE, LUMBAR ONE KYPHOPLASTY;  Surgeon: Barnett Abu, MD;  Location: [facility] OR;  Service: Neurosurgery;  Laterality: N/A;   LEFT HEART CATH AND CORONARY ANGIOGRAPHY N/A 11/13/2021   Procedure: LEFT HEART CATH AND CORONARY ANGIOGRAPHY;  Surgeon: Corky Crafts, MD;  Location: [facility] INVASIVE CV LAB;  Service: Cardiovascular;  Laterality: N/A;   NECK SURGERY      Current Medications: Current Meds  Medication Sig   albuterol (PROVENTIL) (2.5 MG/3ML) 0.083% nebulizer solution Take 2.5 mg by nebulization every 6 (six) hours as needed for wheezing or shortness of breath.   albuterol (VENTOLIN HFA) 108 (90 Base) MCG/ACT inhaler Inhale 1-2 puffs into the lungs every 6 (six) hours as needed for wheezing or shortness of breath.   amitriptyline (ELAVIL) 50 MG tablet Take 50 mg by mouth at bedtime.   apixaban (ELIQUIS) 5 MG TABS tablet Take 1 tablet (5 mg total) by mouth 2 (two) times daily.   Aspirin-Acetaminophen-Caffeine (GOODYS EXTRA STRENGTH) 500-325-65 MG PACK Take 1-2 packets by mouth daily as needed (pain).   baclofen (LIORESAL) 10 MG tablet Take 10 mg by mouth 3 (three) times daily as needed.   buPROPion (WELLBUTRIN XL) 150 MG 24 hr tablet Take 150 mg by mouth daily.   busPIRone (BUSPAR) 30 MG tablet Take 30 mg by mouth 3 (three) times daily.   cholecalciferol (VITAMIN D3) 25 MCG (1000 UNIT) tablet Take 1,000 Units by mouth daily.   diphenhydrAMINE (BENADRYL) 25 MG tablet Take 50 mg by mouth daily.   DULoxetine (CYMBALTA) 60 MG capsule Take 60 mg by mouth daily.   fluticasone (FLONASE) 50 MCG/ACT nasal spray Place 2 sprays into both nostrils daily as needed for allergies or rhinitis.   furosemide (LASIX) 20 MG tablet Take 20-40 mg by mouth daily.   gabapentin (NEURONTIN) 300 MG capsule Take 300 mg by mouth 3 (three) times daily.   levocetirizine (XYZAL) 5 MG tablet Take 5 mg by mouth every evening.   metoprolol succinate (TOPROL-XL) 50 MG 24  hr tablet Take 50 mg by mouth daily. Take with or immediately following a meal.   mupirocin cream (BACTROBAN) 2 % Apply 1 Application topically 2 (two) times daily as needed (wound care).   mupirocin ointment (BACTROBAN) 2 % Apply 1 Application topically 3 (three) times daily.   omeprazole (PRILOSEC) 40 MG capsule Take 40 mg by mouth at bedtime.   oxyCODONE (ROXICODONE) 15 MG immediate release tablet Take 15 mg by mouth 4 (four) times daily as needed for pain.   sulfamethoxazole-trimethoprim (BACTRIM DS) 800-160 MG tablet Take 1 tablet by mouth 2 (two) times daily.   Tamsulosin HCl (FLOMAX) 0.4 MG CAPS Take 0.8 mg  by mouth daily.   tiZANidine (ZANAFLEX) 4 MG capsule Take 4 mg by mouth 3 (three) times daily.   traZODone (DESYREL) 50 MG tablet Take 100 mg by mouth at bedtime. For sleep     Allergies:   Strawberry extract   Social History   Socioeconomic History   Marital status: Married    Spouse name: Not on file   Number of children: Not on file   Years of education: Not on file   Highest education level: Not on file  Occupational History   Not on file  Tobacco Use   Smoking status: Every Day    Current packs/day: 1.00    Types: Cigarettes   Smokeless tobacco: Never  Vaping Use   Vaping status: Never Used  Substance and Sexual Activity   Alcohol use: Not Currently   Drug use: No   Sexual activity: Not on file  Other Topics Concern   Not on file  Social History Narrative   Not on file   Social Drivers of Health   Financial Resource Strain: Not on file  Food Insecurity: Not on file  Transportation Needs: Not on file  Physical Activity: Not on file  Stress: Not on file  Social Connections: Not on file     Family History: The patient's family history includes Alcoholism in his father and mother; Coronary artery disease in his father; Hypertension in his father. There is no history of Anesthesia problems. ROS:   Please see the history of present illness.    All 14  point review of systems negative except as described per history of present illness  EKGs/Labs/Other Studies Reviewed:         Recent Labs: 12/15/2022: ALT 7  Recent Lipid Panel    Component Value Date/Time   CHOL 100 12/15/2022 1403   TRIG 88 12/15/2022 1403   HDL 34 (L) 12/15/2022 1403   CHOLHDL 2.9 12/15/2022 1403   LDLCALC 49 12/15/2022 1403   LDLDIRECT 86 04/01/2022 1313    Physical Exam:    VS:  BP (!) 158/88 (BP Location: Left Arm, Patient Position: Sitting)   Pulse 88   Ht 5\' 7"  (1.702 m)   Wt 269 lb (122 kg)   SpO2 90%   BMI 42.13 kg/m     Wt Readings from Last 3 Encounters:  04/26/23 269 lb (122 kg)  11/18/22 268 lb (121.6 kg)  04/01/22 280 lb (127 kg)     GEN:  Well nourished, well developed in no acute distress HEENT: Normal NECK: No JVD; No carotid bruits LYMPHATICS: No lymphadenopathy CARDIAC: RRR, no murmurs, no rubs, no gallops RESPIRATORY:  Clear to auscultation without rales, wheezing or rhonchi  ABDOMEN: Soft, non-tender, non-distended MUSCULOSKELETAL:  No edema; No deformity  SKIN: Warm and dry LOWER EXTREMITIES: no swelling NEUROLOGIC:  Alert and oriented x 3 PSYCHIATRIC:  Normal affect   ASSESSMENT:    1. Preop cardiovascular exam   2. Coronary artery disease of native artery of native heart with stable angina pectoris (HCC)   3. Dilated cardiomyopathy (HCC) normalization of left ventricle ejection fraction in summer 2023   4. Primary hypertension   5. Centrilobular emphysema (HCC)    PLAN:    In order of problems listed above:  Preop evaluation for 11 teeth extraction.  I will contact dentist to find out more details about the procedure question I will have is what kind of anesthesia coming to used as well as possibility of doing procedure with Eliquis on board.  From cardiac standpoint review if we talk about doing this procedure under sedation and local anesthesia that will be doable however using general anesthesia will carry  significant risk because of multiple problems including cardiac problem with coronary artery disease with occluded right coronary artery and obtuse marginal branch, what makes even more is his respiratory status he does have advanced COPD and oxygen all the time obviously tachycardia significant risks under general seizure I think it would be high risk procedure. History of dilated cardiomyopathy echocardiogram from summer of this year showed lower limits of normal ejection fraction he is on guideline directed medical therapy which we will continue. Dyslipidemia he had diarrhea with Crestor was stopped Crestor we will put him on Lipitor.   Medication Adjustments/Labs and Tests Ordered: Current medicines are reviewed at length with the patient today.  Concerns regarding medicines are outlined above.  No orders of the defined types were placed in this encounter.  Medication changes: No orders of the defined types were placed in this encounter.   Signed, Georgeanna Lea, MD, [facility] 04/26/2023 3:25 PM    [facility]

## 2023-04-26 NOTE — Patient Instructions (Signed)
Medication Instructions:   Start: Lipitor 40mg  1 tablet daily  HOLD: Eliquis 2 days prior to Dental procedure  Dentist will call to reschedule   Lab Work: None Ordered If you have labs (blood work) drawn today and your tests are completely normal, you will receive your results only by: MyChart Message (if you have MyChart) OR A paper copy in the mail If you have any lab test that is abnormal or we need to change your treatment, we will call you to review the results.   Testing/Procedures: None Ordered   Follow-Up: At [facility], you and your health needs are our priority.  As part of our continuing mission to provide you with exceptional heart care, we have created designated Provider Care Teams.  These Care Teams include your primary Cardiologist (physician) and Advanced Practice Providers (APPs -  Physician Assistants and Nurse Practitioners) who all work together to provide you with the care you need, when you need it.  We recommend signing up for the patient portal called "MyChart".  Sign up information is provided on this After Visit Summary.  MyChart is used to connect with patients for Virtual Visits (Telemedicine).  Patients are able to view lab/test results, encounter notes, upcoming appointments, etc.  Non-urgent messages can be sent to your provider as well.   To learn more about what you can do with MyChart, go to [email or web address].    Your next appointment:   Keep follow up appt  The format for your next appointment:   In Person  Provider:   Gypsy Balsam, MD    Other Instructions NA

## 2023-04-26 NOTE — Addendum Note (Signed)
Addended by: Baldo Ash D on: 04/26/2023 03:47 PM   Modules accepted: Orders

## 2023-04-29 ENCOUNTER — Telehealth: Payer: Self-pay | Admitting: *Deleted

## 2023-04-29 NOTE — Telephone Encounter (Signed)
 I sw/ the pt's wife and asked if the pt is seeing another cardiology practice. Wife states he see's Dr. Bernie and just saw MD 04/26/23 and was cleared for his dental procedure.   In review I do not see any notes . Pt's wife tells me that the pt is [name], [name]. DOB [birth year].  They have a son Jodi Kappes, III DOB 10/24/75.   I explained to the pt's wife I have a feeling that there are 2 charts for the pt, one that may list as [name]. I have looked and could not find another chart though. I assured the pt's wife that I am going to reach out to Dr. Karry nurse/CMA to see what med rec# they have the pt under.    I assured her I will call her back and let her know what I find out. Pt's wife thanked me for the help.   I was able to find a [name] DOB [birth year], address: [address]  [place] [address]   I called the pt back and confirmed last name spelling with the pt's wife. This clearance request on this chart is on the wrong the pt. Looks like a chart was built but this is incorrect spelling for the pt.   I am going to update the correct chart with all the notes from today.

## 2023-04-29 NOTE — Telephone Encounter (Signed)
 I sw/ the pt's wife and asked if the pt is seeing another cardiology practice. Wife states he see's Dr. Bernie and just saw MD 04/26/23 and was cleared for his dental procedure.   In review I do not see any notes . Pt's wife tells me that the pt is [name], [name]. DOB December 28, 2055.  They have a son Bianca Vester, III DOB 10/24/75.   I explained to the pt's wife I have a feeling that there are 2 charts for the pt, one that may list as [name]. I have looked and could not find another chart though. I assured the pt's wife that I am going to reach out to Dr. Karry nurse/CMA to see what med rec# they have the pt under.    I assured her I will call her back and let her know what I find out. Pt's wife thanked me for the help.   I was able to find a [name] DOB December 28, 2055, address: [address]  [place] [address]   I called the pt back and confirmed last name spelling with the pt's wife. This clearance request on this chart is on the wrong the pt. Looks like a chart was built but this is incorrect spelling for the pt.   I am going to update the correct chart with all the notes from today.   See clearance notes in the chart for  dental procedure. Clearance request 04/22/23.

## 2023-05-01 DIAGNOSIS — J449 Chronic obstructive pulmonary disease, unspecified: Secondary | ICD-10-CM | POA: Diagnosis not present

## 2023-05-10 DIAGNOSIS — M47816 Spondylosis without myelopathy or radiculopathy, lumbar region: Secondary | ICD-10-CM | POA: Diagnosis not present

## 2023-05-10 DIAGNOSIS — M47812 Spondylosis without myelopathy or radiculopathy, cervical region: Secondary | ICD-10-CM | POA: Diagnosis not present

## 2023-05-10 DIAGNOSIS — Z1389 Encounter for screening for other disorder: Secondary | ICD-10-CM | POA: Diagnosis not present

## 2023-05-10 DIAGNOSIS — G894 Chronic pain syndrome: Secondary | ICD-10-CM | POA: Diagnosis not present

## 2023-06-01 DIAGNOSIS — J449 Chronic obstructive pulmonary disease, unspecified: Secondary | ICD-10-CM | POA: Diagnosis not present

## 2023-06-08 DIAGNOSIS — M549 Dorsalgia, unspecified: Secondary | ICD-10-CM | POA: Diagnosis not present

## 2023-06-08 DIAGNOSIS — G894 Chronic pain syndrome: Secondary | ICD-10-CM | POA: Diagnosis not present

## 2023-06-08 DIAGNOSIS — M47816 Spondylosis without myelopathy or radiculopathy, lumbar region: Secondary | ICD-10-CM | POA: Diagnosis not present

## 2023-06-08 DIAGNOSIS — Z1389 Encounter for screening for other disorder: Secondary | ICD-10-CM | POA: Diagnosis not present

## 2023-06-10 ENCOUNTER — Ambulatory Visit: Payer: Medicare Other | Admitting: Gastroenterology

## 2023-06-17 ENCOUNTER — Ambulatory Visit: Payer: Medicare Other | Admitting: Cardiology

## 2023-06-29 DIAGNOSIS — J449 Chronic obstructive pulmonary disease, unspecified: Secondary | ICD-10-CM | POA: Diagnosis not present

## 2023-07-06 DIAGNOSIS — M47816 Spondylosis without myelopathy or radiculopathy, lumbar region: Secondary | ICD-10-CM | POA: Diagnosis not present

## 2023-07-06 DIAGNOSIS — G894 Chronic pain syndrome: Secondary | ICD-10-CM | POA: Diagnosis not present

## 2023-07-06 DIAGNOSIS — Z1389 Encounter for screening for other disorder: Secondary | ICD-10-CM | POA: Diagnosis not present

## 2023-07-06 DIAGNOSIS — M47812 Spondylosis without myelopathy or radiculopathy, cervical region: Secondary | ICD-10-CM | POA: Diagnosis not present

## 2023-07-08 DIAGNOSIS — E785 Hyperlipidemia, unspecified: Secondary | ICD-10-CM | POA: Diagnosis not present

## 2023-07-08 DIAGNOSIS — I43 Cardiomyopathy in diseases classified elsewhere: Secondary | ICD-10-CM | POA: Diagnosis not present

## 2023-07-08 DIAGNOSIS — E1165 Type 2 diabetes mellitus with hyperglycemia: Secondary | ICD-10-CM | POA: Diagnosis not present

## 2023-07-08 DIAGNOSIS — L0100 Impetigo, unspecified: Secondary | ICD-10-CM | POA: Diagnosis not present

## 2023-07-08 DIAGNOSIS — H0015 Chalazion left lower eyelid: Secondary | ICD-10-CM | POA: Diagnosis not present

## 2023-07-08 DIAGNOSIS — F17200 Nicotine dependence, unspecified, uncomplicated: Secondary | ICD-10-CM | POA: Diagnosis not present

## 2023-07-08 DIAGNOSIS — I110 Hypertensive heart disease with heart failure: Secondary | ICD-10-CM | POA: Diagnosis not present

## 2023-07-08 DIAGNOSIS — J439 Emphysema, unspecified: Secondary | ICD-10-CM | POA: Diagnosis not present

## 2023-07-08 DIAGNOSIS — E1169 Type 2 diabetes mellitus with other specified complication: Secondary | ICD-10-CM | POA: Diagnosis not present

## 2023-07-15 ENCOUNTER — Telehealth: Payer: Self-pay | Admitting: Cardiology

## 2023-07-15 NOTE — Telephone Encounter (Signed)
°  Patient calling the office for samples of medication: ° ° °1.  What medication and dosage are you requesting samples for? °Eliquis  °2.  Are you currently out of this medication?  °Yes  ° ° °

## 2023-07-16 MED ORDER — APIXABAN 5 MG PO TABS: 5.0000 mg | ORAL_TABLET | Freq: Two times a day (BID) | ORAL | Status: DC

## 2023-07-16 NOTE — Telephone Encounter (Signed)
 LM informing the patient samples ready for pick up.

## 2023-07-30 DIAGNOSIS — J449 Chronic obstructive pulmonary disease, unspecified: Secondary | ICD-10-CM | POA: Diagnosis not present

## 2023-08-03 DIAGNOSIS — M549 Dorsalgia, unspecified: Secondary | ICD-10-CM | POA: Diagnosis not present

## 2023-08-03 DIAGNOSIS — M47812 Spondylosis without myelopathy or radiculopathy, cervical region: Secondary | ICD-10-CM | POA: Diagnosis not present

## 2023-08-03 DIAGNOSIS — M47816 Spondylosis without myelopathy or radiculopathy, lumbar region: Secondary | ICD-10-CM | POA: Diagnosis not present

## 2023-08-03 DIAGNOSIS — Z1389 Encounter for screening for other disorder: Secondary | ICD-10-CM | POA: Diagnosis not present

## 2023-08-03 DIAGNOSIS — G894 Chronic pain syndrome: Secondary | ICD-10-CM | POA: Diagnosis not present

## 2023-08-29 DIAGNOSIS — J449 Chronic obstructive pulmonary disease, unspecified: Secondary | ICD-10-CM | POA: Diagnosis not present

## 2023-09-01 ENCOUNTER — Telehealth: Payer: Self-pay | Admitting: Cardiology

## 2023-09-01 MED ORDER — APIXABAN 5 MG PO TABS: 5.0000 mg | ORAL_TABLET | Freq: Two times a day (BID) | ORAL | Status: DC

## 2023-09-01 NOTE — Addendum Note (Signed)
 Addended by: Einar Grave on: 09/01/2023 03:21 PM   Modules accepted: Orders

## 2023-09-01 NOTE — Telephone Encounter (Signed)
 Advised samples are ready for pt.  Eliquis  dose 5 mg remains dose needed.  Age 68 Weight 122 kg

## 2023-09-01 NOTE — Telephone Encounter (Signed)
 Patient calling the office for samples of medication:   1.  What medication and dosage are you requesting samples for? apixaban (ELIQUIS) 5 MG TABS tablet  2.  Are you currently out of this medication? Yes

## 2023-09-13 DIAGNOSIS — G894 Chronic pain syndrome: Secondary | ICD-10-CM | POA: Diagnosis not present

## 2023-09-29 DIAGNOSIS — J449 Chronic obstructive pulmonary disease, unspecified: Secondary | ICD-10-CM | POA: Diagnosis not present

## 2023-10-12 DIAGNOSIS — E785 Hyperlipidemia, unspecified: Secondary | ICD-10-CM | POA: Diagnosis not present

## 2023-10-12 DIAGNOSIS — E1165 Type 2 diabetes mellitus with hyperglycemia: Secondary | ICD-10-CM | POA: Diagnosis not present

## 2023-10-12 DIAGNOSIS — E1169 Type 2 diabetes mellitus with other specified complication: Secondary | ICD-10-CM | POA: Diagnosis not present

## 2023-10-12 DIAGNOSIS — I110 Hypertensive heart disease with heart failure: Secondary | ICD-10-CM | POA: Diagnosis not present

## 2023-10-12 DIAGNOSIS — J439 Emphysema, unspecified: Secondary | ICD-10-CM | POA: Diagnosis not present

## 2023-10-12 DIAGNOSIS — I43 Cardiomyopathy in diseases classified elsewhere: Secondary | ICD-10-CM | POA: Diagnosis not present

## 2023-10-15 DIAGNOSIS — M47816 Spondylosis without myelopathy or radiculopathy, lumbar region: Secondary | ICD-10-CM | POA: Diagnosis not present

## 2023-10-29 DIAGNOSIS — J449 Chronic obstructive pulmonary disease, unspecified: Secondary | ICD-10-CM | POA: Diagnosis not present

## 2023-11-06 ENCOUNTER — Other Ambulatory Visit: Payer: Self-pay | Admitting: Cardiology

## 2023-11-16 ENCOUNTER — Other Ambulatory Visit: Payer: Self-pay | Admitting: Cardiology

## 2023-12-02 ENCOUNTER — Other Ambulatory Visit: Payer: Self-pay | Admitting: Cardiology

## 2023-12-07 DIAGNOSIS — M47816 Spondylosis without myelopathy or radiculopathy, lumbar region: Secondary | ICD-10-CM | POA: Diagnosis not present

## 2023-12-07 DIAGNOSIS — Z79891 Long term (current) use of opiate analgesic: Secondary | ICD-10-CM | POA: Diagnosis not present

## 2023-12-10 ENCOUNTER — Telehealth: Payer: Self-pay | Admitting: Cardiology

## 2023-12-10 ENCOUNTER — Telehealth: Payer: Self-pay

## 2023-12-10 MED ORDER — APIXABAN 5 MG PO TABS: 5.0000 mg | ORAL_TABLET | Freq: Two times a day (BID) | ORAL | Status: DC

## 2023-12-10 NOTE — Telephone Encounter (Signed)
 Eliquis  5mg  #70 samples provided to pt

## 2023-12-10 NOTE — Telephone Encounter (Signed)
 Pt c/o medication issue:  1. Name of Medication: apixaban  (ELIQUIS ) 5 MG TABS tablet   2. How are you currently taking this medication (dosage and times per day)?    3. Are you having a reaction (difficulty breathing--STAT)? no  4. What is your medication issue? Calling to see if our office have any samples. Please advise

## 2023-12-16 ENCOUNTER — Encounter: Payer: Self-pay | Admitting: Pharmacist

## 2023-12-16 ENCOUNTER — Telehealth: Payer: Self-pay | Admitting: Pharmacist

## 2023-12-16 NOTE — Progress Notes (Signed)
 Pharmacy Quality Measure Review  This patient is appearing on a report for being at risk of failing the adherence measure for cholesterol (statin) medications this calendar year.   Medication: atorvastatin  40 mg 1 tab PO daily  Last fill date: 10/07/2023 for 90 day supply  Insurance report was not up to date. No action needed at this time.    Annabella Galla, PharmD Clinical Pharmacist [place] Direct Dial: [phone]

## 2023-12-16 NOTE — Progress Notes (Signed)
   12/16/2023  Patient ID: [name], male   DOB: Aug 13, 1955, 68 y.o.   MRN: [id number]  Telephonic engagement with Mr. Enke today via telephone regarding referral for patient assistance with Eliquis . Reviewed barrier with patient's qualifying for [employer] in 2025 due to new program requirements. Reviewed change in therapy to Xarelto. Patient is agreeable to change. Advised patient that I will beed to approve the change in therapy with Cardiology. Informed patient that I will follow up with him next week. Patient confirms that he has Eliquis  on hand at this time.   Annabella Galla, PharmD Clinical Pharmacist [place] Direct Dial: [phone]

## 2023-12-24 ENCOUNTER — Telehealth: Payer: Self-pay | Admitting: Pharmacist

## 2023-12-24 NOTE — Progress Notes (Signed)
 Attempted to contact patient for medication management re: Xarelto assistance. Left HIPAA compliant message for patient to return my call at their convenience.   Annabella Galla, PharmD Clinical Pharmacist [place] Direct Dial: [phone]

## 2023-12-30 ENCOUNTER — Telehealth: Payer: Self-pay

## 2023-12-30 ENCOUNTER — Telehealth: Payer: Self-pay | Admitting: Cardiology

## 2023-12-30 NOTE — Telephone Encounter (Signed)
 Completed patient assistance application for Xarelto with [employer] and emailed to [place] to have patient and provider sign at upcoming appointment.

## 2023-12-30 NOTE — Telephone Encounter (Signed)
 Tiffany from [facility] requesting a update in regards to medication management re Xarelto assistance.

## 2024-01-04 NOTE — Telephone Encounter (Signed)
 LVM for [name] regarding change from Eliquis  to Xarelto 20mg  1 daily

## 2024-01-10 ENCOUNTER — Telehealth: Payer: Self-pay

## 2024-01-10 NOTE — Telephone Encounter (Addendum)
 Completed patient assistance application for Breztri with [employer] and emailed to Tiffany to have patient and provider review and sign at upcoming appointment

## 2024-01-13 NOTE — Telephone Encounter (Signed)
 PAP: Application for Xarelto  has been submitted to [employer] ([employer]), via fax

## 2024-01-13 NOTE — Telephone Encounter (Signed)
 Spoke with rep and [employer], they were unable to do a soft credit check on patient so they need POI.

## 2024-01-13 NOTE — Telephone Encounter (Signed)
 PAP: Application for [name] has been submitted to [employer] ([employer]), via fax

## 2024-01-14 NOTE — Telephone Encounter (Signed)
 PAP: Patient assistance application for Breztri has been approved by PAP Companies: [employer] from 01/14/2024 to 04/26/2025. Medication should be delivered to PAP Delivery: Home. For further shipping updates, please contact [employer] ([employer]) at [phone]. Patient ID is: [id number]

## 2024-01-14 NOTE — Progress Notes (Signed)
 Pharmacy Medication Assistance Program Note    01/14/2024  Patient ID: [name], male   DOB: [birth year], 68 y.o.   MRN: [id number]     12/30/2023 01/10/2024  Outreach Medication One  Initial Outreach Date (Medication One) 12/30/2023 01/10/2024  Manufacturer Medication One [facility] Xarelto   Dose of Xarelto 20mg    Type of Forensic scientist Assistance  Date Application Sent to Patient 12/30/2023   Application Items Requested Application   Date Application Sent to Prescriber 12/30/2023   Name of Prescriber Delon Contes   Date Application Received From Patient 01/13/2024   Application Items Received From Patient Application   Date Application Received From Provider 01/13/2024   Date Application Submitted to Manufacturer 01/13/2024   Method Application Sent to Manufacturer Fax      Signature

## 2024-01-15 ENCOUNTER — Other Ambulatory Visit: Payer: Self-pay | Admitting: Cardiology

## 2024-01-17 ENCOUNTER — Telehealth: Payer: Self-pay

## 2024-01-17 ENCOUNTER — Other Ambulatory Visit (HOSPITAL_COMMUNITY): Payer: Self-pay

## 2024-01-17 NOTE — Telephone Encounter (Signed)
 Pt. Was able to get [employer]

## 2024-01-17 NOTE — Telephone Encounter (Signed)
 Pharmacy Patient Advocate Encounter  Insurance verification completed.   The patient is insured through [employer] claim for Eliquis. Currently a quantity of 60 is a 30 day supply and the co-pay is $47 .   This test claim was processed through [facility]- copay amounts may vary at other pharmacies due to pharmacy/plan contracts, or as the patient moves through the different stages of their insurance plan.

## 2024-01-17 NOTE — Telephone Encounter (Signed)
  The patient was approved for a [employer] grant that will help cover the cost of Eliquis  Total amount awarded, $10,000.  Effective: 12/18/2023 - 12/16/2024   APW:[id number] ERW:[id number] Hmnle:[id number] [id number]   Pharmacy provided with approval and processing information. Patient informed via Phone call  Inocente Butcher CPhT  Pharmacy Patient Advocate Specialist  Direct Number: [phone] Fax: [phone]

## 2024-01-18 DIAGNOSIS — Z79891 Long term (current) use of opiate analgesic: Secondary | ICD-10-CM | POA: Diagnosis not present

## 2024-01-18 DIAGNOSIS — M47816 Spondylosis without myelopathy or radiculopathy, lumbar region: Secondary | ICD-10-CM | POA: Diagnosis not present

## 2024-01-18 DIAGNOSIS — G894 Chronic pain syndrome: Secondary | ICD-10-CM | POA: Diagnosis not present

## 2024-01-25 DIAGNOSIS — G894 Chronic pain syndrome: Secondary | ICD-10-CM | POA: Diagnosis not present

## 2024-01-25 DIAGNOSIS — M47816 Spondylosis without myelopathy or radiculopathy, lumbar region: Secondary | ICD-10-CM | POA: Diagnosis not present

## 2024-03-07 NOTE — Progress Notes (Signed)
 [name]                                          MRN: [id number]   03/07/2024   The [facility] Quality Team Specialist reviewed this patient medical record for the purposes of chart review for care gap closure. The following were reviewed: chart review for care gap closure-diabetic eye exam.    [facility]BCI Quality Team

## 2024-03-09 ENCOUNTER — Ambulatory Visit: Attending: Cardiology | Admitting: Cardiology

## 2024-03-09 ENCOUNTER — Encounter: Payer: Self-pay | Admitting: Cardiology

## 2024-03-09 VITALS — BP 140/90 | HR 64 | Ht 67.0 in | Wt 255.0 lb

## 2024-03-09 DIAGNOSIS — J432 Centrilobular emphysema: Secondary | ICD-10-CM | POA: Diagnosis not present

## 2024-03-09 DIAGNOSIS — I25118 Atherosclerotic heart disease of native coronary artery with other forms of angina pectoris: Secondary | ICD-10-CM | POA: Diagnosis not present

## 2024-03-09 DIAGNOSIS — I10 Essential (primary) hypertension: Secondary | ICD-10-CM | POA: Diagnosis not present

## 2024-03-09 DIAGNOSIS — Z9981 Dependence on supplemental oxygen: Secondary | ICD-10-CM | POA: Diagnosis not present

## 2024-03-09 DIAGNOSIS — R0609 Other forms of dyspnea: Secondary | ICD-10-CM

## 2024-03-09 MED ORDER — APIXABAN 5 MG PO TABS: 5.0000 mg | ORAL_TABLET | Freq: Two times a day (BID) | ORAL | Status: DC

## 2024-03-09 MED ORDER — APIXABAN 5 MG PO TABS: 5.0000 mg | ORAL_TABLET | Freq: Two times a day (BID) | ORAL | 3 refills | Status: AC

## 2024-03-09 NOTE — Addendum Note (Signed)
 Addended by: ARLOA PLANAS D on: 03/09/2024 11:14 AM   Modules accepted: Orders

## 2024-03-09 NOTE — Progress Notes (Signed)
 Cardiology Office Note:    Date:  03/09/2024   ID:  [name], DOB 01-26-1956, MRN [id number]  PCP:  Clemmie Nest, MD  Cardiologist:  Lamar Fitch, MD    Referring MD: Clemmie Nest, MD   Chief Complaint  Patient presents with   Annual Exam    History of Present Illness:    [name] is a 68 y.o. male past medical history significant for advanced COPD, he is on oxygen  all the time, sadly continues to smoke.  Additional problem include essential hypertension, borderline diabetes, dyslipidemia initially referred to us  because of diminished ejection fraction stress test was done was abnormal cardiac catheterization done showing completely occluded right coronary artery as well as occluded obtuse marginal branch.  At that time decision has been made to pursue medical therapy.  Additional problem include DVT he is anticoagulated. He comes today to months for follow-up overall cardiac wise doing well lives a very sedentary lifestyle.  He still smokes quite heavily his fingers are yellow from smoking.  Use oxygen  all the time.  Denies have any chest pain tightness squeezing pressure burning chest no dizziness no passing out no TIA/CVA-like symptoms.  Past Medical History:  Diagnosis Date   Anxiety    went off medicines for anxiety 6 months ago ( 12/2010), pt. stays nervous, per pt.    Arthritis    lumbar stenosis    Benign prostatic hyperplasia with lower urinary tract symptoms    Chronic pain syndrome    Chronic pansinusitis    Chronic pharyngitis    Compression fracture of thoracic vertebra (HCC)    T11,T12 and L1   Contusion of thorax, unspecified, initial encounter    COPD (chronic obstructive pulmonary disease) (HCC)    Coronary artery disease completely occluded RCA, completely occluded obtuse marginal with collateralization based on cardiac cath from 2023 11/07/2021   Cysts    groin area (on & off- for 4 +yrs.) for which he uses Doxycyline on going, BID,  Rx fr. Dr. Kendell in [place], [place].,     Dependence on supplemental oxygen     Depression    Diabetes mellitus [facility])    denied DM2 history 11/24/19; A1c 6.1% 02/02/18 (pre-diabetes)   Dilated cardiomyopathy (HCC) normalization of left ventricle ejection fraction in summer 2023 05/06/2021   Drug induced constipation    Dysphagia    Finger infection    05/2011, L hand, index finger  - I&D in MD office, treated /w antibiotic.SABRASABRASABRA?name   GERD (gastroesophageal reflux disease)    Headache(784.0)    uses excedrin prn   History of colon polyps    History of normal resting EKG    2009, preop   Hypertension    Long term (current) use of opiate analgesic    Myocardial infarction [facility])    denied MI, but reported LVF mildly decreased in the past 11/24/19; LVEF 50-55% 2018 by echo, LVEF 48% on non-ischemic Dobutamine  stress test 01/2015   Neuromuscular disorder (HCC)    pseudoarthrosis    Normal cardiac stress test    2005, done prior to back surgery   Other chronic nonsuppurative otitis media, bilateral    Shortness of breath    pt. reports related to pain    Sleep apnea    study- 2012[facility] , Dr. Mardee, [address]  , uses CPAP  qo night    Wears glasses     Past Surgical History:  Procedure Laterality Date   APPENDECTOMY  as a teen    BACK SURGERY     multiple, lumbar & cerv. fusions, last low back- 2009   COLONOSCOPY  06/06/2010   Colon polyps- status post polypectomy. Moderate predominantly sigmoid diverticulosis. Internal hemorrhoids.   ESOPHAGOGASTRODUODENOSCOPY  06/14/2008   Mild gastritis. Otherwise normal EGD.    HEMORROIDECTOMY     2012[facility]   KYPHOPLASTY N/A 11/27/2019   Procedure: THORACIC ELEVEN, THORACIC TWELVE, LUMBAR ONE KYPHOPLASTY;  Surgeon: Colon Shove, MD;  Location: [facility] OR;  Service: Neurosurgery;  Laterality: N/A;   LEFT HEART CATH AND CORONARY ANGIOGRAPHY N/A 11/13/2021   Procedure: LEFT HEART CATH AND CORONARY ANGIOGRAPHY;   Surgeon: Dann Candyce RAMAN, MD;  Location: [facility] INVASIVE CV LAB;  Service: Cardiovascular;  Laterality: N/A;   NECK SURGERY      Current Medications: Current Meds  Medication Sig   albuterol  (PROVENTIL ) (2.5 MG/3ML) 0.083% nebulizer solution Take 2.5 mg by nebulization every 6 (six) hours as needed for wheezing or shortness of breath.   albuterol  (VENTOLIN  HFA) 108 (90 Base) MCG/ACT inhaler Inhale 1-2 puffs into the lungs every 6 (six) hours as needed for wheezing or shortness of breath.   amitriptyline (ELAVIL) 50 MG tablet Take 50 mg by mouth at bedtime.   apixaban  (ELIQUIS ) 5 MG TABS tablet Take 5 mg by mouth daily.   Aspirin -Acetaminophen -Caffeine  (GOODYS EXTRA STRENGTH) 500-325-65 MG PACK Take 1-2 packets by mouth daily as needed (pain).   atorvastatin  (LIPITOR) 40 MG tablet Take 1 tablet (40 mg total) by mouth daily.   baclofen (LIORESAL) 10 MG tablet Take 10 mg by mouth 3 (three) times daily as needed.   buPROPion (WELLBUTRIN XL) 150 MG 24 hr tablet Take 150 mg by mouth daily.   busPIRone  (BUSPAR ) 30 MG tablet Take 30 mg by mouth 3 (three) times daily.   cholecalciferol (VITAMIN D3) 25 MCG (1000 UNIT) tablet Take 1,000 Units by mouth daily.   diphenhydrAMINE (BENADRYL) 25 MG tablet Take 50 mg by mouth daily.   DULoxetine  (CYMBALTA ) 60 MG capsule Take 60 mg by mouth daily.   fluticasone  (FLONASE ) 50 MCG/ACT nasal spray Place 2 sprays into both nostrils daily as needed for allergies or rhinitis.   furosemide  (LASIX ) 20 MG tablet Take 20-40 mg by mouth daily.   gabapentin  (NEURONTIN ) 300 MG capsule Take 300 mg by mouth 3 (three) times daily.   levocetirizine (XYZAL) 5 MG tablet Take 5 mg by mouth every evening.   metoprolol  succinate (TOPROL -XL) 50 MG 24 hr tablet Take 50 mg by mouth daily. Take with or immediately following a meal.   mupirocin cream (BACTROBAN) 2 % Apply 1 Application topically 2 (two) times daily as needed (wound care).   mupirocin ointment (BACTROBAN) 2 % Apply 1  Application topically 3 (three) times daily.   omeprazole  (PRILOSEC) 40 MG capsule Take 40 mg by mouth at bedtime.   oxyCODONE  (ROXICODONE ) 15 MG immediate release tablet Take 15 mg by mouth 4 (four) times daily as needed for pain.   Tamsulosin  HCl (FLOMAX ) 0.4 MG CAPS Take 0.8 mg by mouth daily.   tiZANidine  (ZANAFLEX ) 4 MG capsule Take 4 mg by mouth 3 (three) times daily.   traZODone  (DESYREL ) 50 MG tablet Take 100 mg by mouth at bedtime. For sleep   [DISCONTINUED] apixaban  (ELIQUIS ) 5 MG TABS tablet Take 1 tablet (5 mg total) by mouth 2 (two) times daily.     Allergies:   Strawberry extract   Social History   Socioeconomic History   Marital status:  Married    Spouse name: Not on file   Number of children: Not on file   Years of education: Not on file   Highest education level: Not on file  Occupational History   Not on file  Tobacco Use   Smoking status: Every Day    Current packs/day: 1.00    Types: Cigarettes   Smokeless tobacco: Never  Vaping Use   Vaping status: Never Used  Substance and Sexual Activity   Alcohol use: Not Currently   Drug use: No   Sexual activity: Not on file  Other Topics Concern   Not on file  Social History Narrative   Not on file   Social Drivers of Health   Financial Resource Strain: Not on file  Food Insecurity: Not on file  Transportation Needs: Not on file  Physical Activity: Not on file  Stress: Not on file  Social Connections: Not on file     Family History: The patient's family history includes Alcoholism in his father and mother; Coronary artery disease in his father; Hypertension in his father. There is no history of Anesthesia problems. ROS:   Please see the history of present illness.    All 14 point review of systems negative except as described per history of present illness  EKGs/Labs/Other Studies Reviewed:         Recent Labs: No results found for requested labs within last 365 days.  Recent Lipid Panel     Component Value Date/Time   CHOL 100 12/15/2022 1403   TRIG 88 12/15/2022 1403   HDL 34 (L) 12/15/2022 1403   CHOLHDL 2.9 12/15/2022 1403   LDLCALC 49 12/15/2022 1403   LDLDIRECT 86 04/01/2022 1313    Physical Exam:    VS:  BP (!) 140/90   Pulse 64   Ht 5' 7 (1.702 m)   Wt 255 lb (115.7 kg)   SpO2 98%   BMI 39.94 kg/m     Wt Readings from Last 3 Encounters:  03/09/24 255 lb (115.7 kg)  04/26/23 269 lb (122 kg)  11/18/22 268 lb (121.6 kg)     GEN:  Well nourished, well developed in no acute distress HEENT: Normal NECK: No JVD; No carotid bruits LYMPHATICS: No lymphadenopathy CARDIAC: RRR, no murmurs, no rubs, no gallops RESPIRATORY:  Clear to auscultation without rales, wheezing or rhonchi  ABDOMEN: Soft, non-tender, non-distended MUSCULOSKELETAL:  No edema; No deformity  SKIN: Warm and dry LOWER EXTREMITIES: no swelling NEUROLOGIC:  Alert and oriented x 3 PSYCHIATRIC:  Normal affect   ASSESSMENT:    1. Coronary artery disease of native artery of native heart with stable angina pectoris   2. Primary hypertension   3. Centrilobular emphysema (HCC)   4. Dependence on supplemental oxygen     PLAN:    In order of problems listed above:  Coronary disease stable from that point.  No symptoms we will continue present management. History of DVT he is taking only Eliquis  once a day I told him to increase dose to full dose to prevent him from having DVT and PE which will be absolutely devastating to him. Emphysema advanced sadly continues to smoke.  Continue present management. Dyslipidemia I did review [facility] which show me LDL 49 HDL 34 this is from last year we will recheck fasting lipid profile today   Medication Adjustments/Labs and Tests Ordered: Current medicines are reviewed at length with the patient today.  Concerns regarding medicines are outlined above.  Orders Placed This Encounter  Procedures   EKG 12-Lead   Medication changes: No orders of the defined  types were placed in this encounter.   Signed, Lamar DOROTHA Fitch, MD, [facility] 03/09/2024 10:55 AM    [facility]

## 2024-03-09 NOTE — Addendum Note (Signed)
 Addended by: ARLOA PLANAS D on: 03/09/2024 11:03 AM   Modules accepted: Orders

## 2024-03-09 NOTE — Patient Instructions (Addendum)
 Medication Instructions:   Eliquis  5mg  1 tablet daily   Lab Work: Lipid, AST, ALT- today If you have labs (blood work) drawn today and your tests are completely normal, you will receive your results only by: MyChart Message (if you have MyChart) OR A paper copy in the mail If you have any lab test that is abnormal or we need to change your treatment, we will call you to review the results.   Testing/Procedures: Your physician has requested that you have an echocardiogram. Echocardiography is a painless test that uses sound waves to create images of your heart. It provides your doctor with information about the size and shape of your heart and how well your heart's chambers and valves are working. This procedure takes approximately one hour. There are no restrictions for this procedure. Please do NOT wear cologne, perfume, aftershave, or lotions (deodorant is allowed). Please arrive 15 minutes prior to your appointment time.  Please note: We ask at that you not bring children with you during ultrasound (echo/ vascular) testing. Due to room size and safety concerns, children are not allowed in the ultrasound rooms during exams. Our front office staff cannot provide observation of children in our lobby area while testing is being conducted. An adult accompanying a patient to their appointment will only be allowed in the ultrasound room at the discretion of the ultrasound technician under special circumstances. We apologize for any inconvenience.    Follow-Up: At [facility], you and your health needs are our priority.  As part of our continuing mission to provide you with exceptional heart care, we have created designated Provider Care Teams.  These Care Teams include your primary Cardiologist (physician) and Advanced Practice Providers (APPs -  Physician Assistants and Nurse Practitioners) who all work together to provide you with the care you need, when you need it.  We recommend signing up  for the patient portal called MyChart.  Sign up information is provided on this After Visit Summary.  MyChart is used to connect with patients for Virtual Visits (Telemedicine).  Patients are able to view lab/test results, encounter notes, upcoming appointments, etc.  Non-urgent messages can be sent to your provider as well.   To learn more about what you can do with MyChart, go to [email or web address].    Your next appointment:   12 month(s)  The format for your next appointment:   In Person  Provider:   Lamar Fitch, MD    Other Instructions NA

## 2024-03-10 LAB — ALT: ALT: 7 IU/L (ref 0–44)

## 2024-03-10 LAB — LIPID PANEL
Chol/HDL Ratio: 2.9 ratio (ref 0.0–5.0)
Cholesterol, Total: 95 mg/dL — ABNORMAL LOW (ref 100–199)
HDL: 33 mg/dL — ABNORMAL LOW (ref >39)
LDL Chol Calc (NIH): 46 mg/dL (ref 0–99)
Triglycerides: 74 mg/dL (ref 0–149)
VLDL Cholesterol Cal: 16 mg/dL (ref 5–40)

## 2024-03-10 LAB — AST: AST: 10 IU/L (ref 0–40)

## 2024-03-15 ENCOUNTER — Ambulatory Visit: Payer: Self-pay | Admitting: Cardiology

## 2024-03-15 NOTE — Telephone Encounter (Signed)
 LVM for patient to return call regarding results.  Alan, RN

## 2024-03-16 NOTE — Telephone Encounter (Signed)
 Pt returning call

## 2024-04-24 ENCOUNTER — Ambulatory Visit

## 2024-05-18 ENCOUNTER — Ambulatory Visit

## 2024-05-25 ENCOUNTER — Encounter: Payer: Self-pay | Admitting: *Deleted

## 2024-05-25 NOTE — Progress Notes (Signed)
 LLIAM HOH                                          MRN: [id number]   05/25/2024   The [facility] Quality Team Specialist reviewed this patient medical record for the purposes of chart review for care gap closure. The following were reviewed: chart review for care gap closure-glycemic status assessment.    [facility]I Quality Team

## 2024-05-29 ENCOUNTER — Encounter: Payer: Self-pay | Admitting: Gastroenterology

## 2024-06-20 ENCOUNTER — Ambulatory Visit
